# Patient Record
Sex: Male | Born: 1955
Health system: Southern US, Community
[De-identification: ages and names within clinical notes are randomized; demographics above are authoritative.]

## PROBLEM LIST (undated history)

## (undated) DIAGNOSIS — I10 Essential (primary) hypertension: Secondary | ICD-10-CM

## (undated) DIAGNOSIS — E785 Hyperlipidemia, unspecified: Secondary | ICD-10-CM

## (undated) DIAGNOSIS — R7303 Prediabetes: Secondary | ICD-10-CM

## (undated) DIAGNOSIS — H409 Unspecified glaucoma: Secondary | ICD-10-CM

## (undated) HISTORY — DX: Essential (primary) hypertension: I10

## (undated) HISTORY — DX: Prediabetes: R73.03

## (undated) HISTORY — DX: Unspecified glaucoma: H40.9

## (undated) HISTORY — DX: Hyperlipidemia, unspecified: E78.5

---

## 2011-10-10 LAB — HM COLONOSCOPY

## 2014-08-18 HISTORY — PX: SHOULDER SURGERY: SHX246

## 2016-10-08 ENCOUNTER — Ambulatory Visit: Payer: Self-pay | Admitting: Family Medicine

## 2016-10-08 ENCOUNTER — Telehealth: Payer: Self-pay | Admitting: General Practice

## 2016-10-08 NOTE — Telephone Encounter (Signed)
Pt called in at 8:02 to reschedule his appt. He's a truck driver. Pt's truck broke down so he is unable to make his appt this morning. Pt rescheduled for tomorrow morning at 8:30.

## 2016-10-09 ENCOUNTER — Ambulatory Visit: Payer: Self-pay | Admitting: Family Medicine

## 2016-10-09 ENCOUNTER — Encounter: Payer: Self-pay | Admitting: Family Medicine

## 2016-10-09 ENCOUNTER — Telehealth: Payer: Self-pay | Admitting: General Practice

## 2016-10-09 ENCOUNTER — Ambulatory Visit (INDEPENDENT_AMBULATORY_CARE_PROVIDER_SITE_OTHER): Payer: BLUE CROSS/BLUE SHIELD | Admitting: Family Medicine

## 2016-10-09 VITALS — BP 164/92 | HR 54 | Temp 97.8°F | Ht 67.0 in | Wt 143.8 lb

## 2016-10-09 DIAGNOSIS — I10 Essential (primary) hypertension: Secondary | ICD-10-CM

## 2016-10-09 DIAGNOSIS — Z0289 Encounter for other administrative examinations: Secondary | ICD-10-CM

## 2016-10-09 DIAGNOSIS — Z122 Encounter for screening for malignant neoplasm of respiratory organs: Secondary | ICD-10-CM | POA: Diagnosis not present

## 2016-10-09 DIAGNOSIS — Z125 Encounter for screening for malignant neoplasm of prostate: Secondary | ICD-10-CM

## 2016-10-09 DIAGNOSIS — Z0000 Encounter for general adult medical examination without abnormal findings: Secondary | ICD-10-CM

## 2016-10-09 HISTORY — DX: Essential (primary) hypertension: I10

## 2016-10-09 LAB — LIPID PANEL
CHOLESTEROL: 138 mg/dL (ref 0–200)
HDL: 49.4 mg/dL (ref >39.00)
LDL Cholesterol: 66 mg/dL (ref 0–99)
NonHDL: 89.02
Total CHOL/HDL Ratio: 3
Triglycerides: 116 mg/dL (ref 0.0–149.0)
VLDL: 23.2 mg/dL (ref 0.0–40.0)

## 2016-10-09 LAB — HEMOGLOBIN A1C: HEMOGLOBIN A1C: 6.1 % (ref 4.6–6.5)

## 2016-10-09 LAB — COMPREHENSIVE METABOLIC PANEL
ALBUMIN: 4.7 g/dL (ref 3.5–5.2)
ALK PHOS: 60 U/L (ref 39–117)
ALT: 7 U/L (ref 0–53)
AST: 13 U/L (ref 0–37)
BUN: 16 mg/dL (ref 6–23)
CALCIUM: 9.9 mg/dL (ref 8.4–10.5)
CO2: 33 mEq/L — ABNORMAL HIGH (ref 19–32)
Chloride: 104 mEq/L (ref 96–112)
Creatinine, Ser: 0.93 mg/dL (ref 0.40–1.50)
GFR: 106.33 mL/min (ref >60.00)
Glucose, Bld: 129 mg/dL — ABNORMAL HIGH (ref 70–99)
POTASSIUM: 4.9 meq/L (ref 3.5–5.1)
Sodium: 141 mEq/L (ref 135–145)
TOTAL PROTEIN: 6.9 g/dL (ref 6.0–8.3)
Total Bilirubin: 0.8 mg/dL (ref 0.2–1.2)

## 2016-10-09 LAB — PSA: PSA: 1.3 ng/mL (ref 0.10–4.00)

## 2016-10-09 MED ORDER — CHLORTHALIDONE 25 MG PO TABS: 25.0000 mg | ORAL_TABLET | Freq: Every day | ORAL | 1 refills | Status: DC

## 2016-10-09 NOTE — Telephone Encounter (Signed)
Pt called  At 8:30 to confirm his appt time. Showing appt time is at 8:30. Pt says that he really need to come in today. Rescheduled pt's appt for 9:30 instead.

## 2016-10-09 NOTE — Progress Notes (Signed)
Chief Complaint  Patient presents with  . Establish Care    cpe    Well Male Jimmy Lane is here for a complete physical.   His last physical was >1 year ago.  Current diet: in general, a "healthy" diet   Current exercise: walking around at rest areas Weight trend: stable Does pt snore? No. Daytime fatigue? No. Seat belt? Yes.    PCV23 Yes Flu Yes  Tetanus 2013  Hypertension Patient presents for hypertension follow up. He does not monitor home blood pressures. He is compliant with medications- Metoprolol 50 mg BID and HCTZ 25 mg daily. Patient has these side effects of medication: none He is sometimes adhering to a healthy diet overall. Exercise: walking at rest stops   Past Medical History:  Diagnosis Date  . Essential hypertension 10/09/2016    Past Surgical History:  Procedure Laterality Date  . SHOULDER SURGERY  2016   Medications  HCTZ 25 MG DAILY Metoprolol 50 mg BID   Allergies No Known Allergies Family History Family History  Problem Relation Age of Onset  . Hypertension Paternal Grandfather     Review of Systems: Constitutional:  no unexpected change in weight, no fevers or chills Eye:  no recent significant change in vision Ear/Nose/Mouth/Throat:  Ears:  no tinnitus or hearing loss Nose/Mouth/Throat:  no complaints of nasal congestion or bleeding, no sore throat and oral sores Cardiovascular:  no chest pain, no palpitations Respiratory:  no cough and no shortness of breath Gastrointestinal:  no abdominal pain, no change in bowel habits, no nausea, vomiting, diarrhea, or constipation and no black or bloody stool GU:  Male: negative for dysuria, frequency, and incontinence and negative for prostate symptoms Musculoskeletal/Extremities:  no pain, redness, or swelling of the joints Integumentary (Skin/Breast):  no abnormal skin lesions reported Neurologic:  no headaches, no numbness, tingling Endocrine:  weight changes, masses in the neck,  heat/cold intolerance, bowel or skin changes, or cardiovascular system symptoms Hematologic/Lymphatic:  no abnormal bleeding, no HIV risk factors, no night sweats, no swollen nodes, no weight loss  Exam BP (!) 164/92 (BP Location: Left Arm, Patient Position: Sitting, Cuff Size: Small)   Pulse (!) 54   Temp 97.8 F (36.6 C) (Oral)   Ht 5\' 7"  (1.702 m)   Wt 143 lb 12.8 oz (65.2 kg)   SpO2 98%   BMI 22.52 kg/m  General:  well developed, well nourished, in no apparent distress Skin:  no significant moles, warts, or growths Head:  no masses, lesions, or tenderness Eyes:  pupils equal and round, sclera anicteric without injection Ears:  canals without lesions, TMs shiny without retraction, no obvious effusion, no erythema Nose:  nares patent, septum midline, mucosa normal Throat/Pharynx:  lips and gingiva without lesion; tongue and uvula midline; non-inflamed pharynx; no exudates or postnasal drainage Neck: neck supple without adenopathy, thyromegaly, or masses Lungs:  clear to auscultation, breath sounds equal bilaterally, no respiratory distress Cardio:  regular rate and rhythm without murmurs, heart sounds without clicks or rubs Abdomen:  abdomen soft, nontender; bowel sounds normal; no masses or organomegaly Genital (male): circumcised penis, no lesions or discharge; testes present bilaterally without masses or tenderness Rectal: Deferred Musculoskeletal:  symmetrical muscle groups noted without atrophy or deformity Extremities:  no clubbing, cyanosis, or edema, no deformities, no skin discoloration Neuro:  gait normal; deep tendon reflexes normal and symmetric Psych: well oriented with normal range of affect and appropriate judgment/insight  Assessment and Plan  Well adult exam - Plan: Comprehensive metabolic  panel, Lipid panel, Hemoglobin A1c  Screening for prostate cancer - Plan: PSA  Essential hypertension - Plan: chlorthalidone (HYGROTON) 25 MG tablet  Encounter for  screening for lung cancer - Plan: CT CHEST LUNG CANCER SCREENING LOW DOSE WO CONTRAST   Well 61 y.o. male. Counseled on diet and exercise. Stop HCTZ, start chlorthalidone. Recheck labs in 2 weeks when he returns. Will add Norvasc vs Lisinopril if still not controlled. Discussed home BP monitoring.  Immunizations, labs, and further orders as above. I will see him in 1 mo.  The patient voiced understanding and agreement to the plan.  [place], DO 10/09/16 10:27 AM

## 2016-10-09 NOTE — Patient Instructions (Addendum)
Around 3 times per week, check your blood pressure 4 times per day. Twice in the morning and twice in the evening. The readings should be at least one minute apart. Write down these values and bring them to your next nurse visit/appointment.  When you check your BP, make sure you have been doing something calm/relaxing 5 minutes prior to checking. Both feet should be flat on the floor and you should be sitting. Use your left arm and make sure it is in a relaxed position (on a table), and that the cuff is at the approximate level/height of your heart.  Stop smoking.

## 2016-10-09 NOTE — Progress Notes (Signed)
Pre visit review using our clinic review tool, if applicable. No additional management support is needed unless otherwise documented below in the visit note. 

## 2016-10-18 ENCOUNTER — Ambulatory Visit (HOSPITAL_BASED_OUTPATIENT_CLINIC_OR_DEPARTMENT_OTHER): Admission: RE | Admit: 2016-10-18 | Payer: BLUE CROSS/BLUE SHIELD | Source: Ambulatory Visit

## 2016-10-20 ENCOUNTER — Other Ambulatory Visit: Payer: BLUE CROSS/BLUE SHIELD

## 2016-10-22 ENCOUNTER — Other Ambulatory Visit: Payer: BLUE CROSS/BLUE SHIELD

## 2016-10-22 ENCOUNTER — Telehealth: Payer: Self-pay | Admitting: Family Medicine

## 2016-10-22 MED ORDER — METOPROLOL TARTRATE 50 MG PO TABS: 50.0000 mg | ORAL_TABLET | Freq: Two times a day (BID) | ORAL | 1 refills | Status: DC

## 2016-10-22 NOTE — Telephone Encounter (Signed)
Done

## 2016-10-22 NOTE — Telephone Encounter (Signed)
Caller name: Relationship to patient: Self Can be reached: [phone]  Pharmacy:  [facility], [place] [phone] (Phone) [phone] (Fax)     Reason for call: Request refill on metoprolol (LOPRESSOR) 50 MG tablet

## 2016-10-22 NOTE — Telephone Encounter (Signed)
Relation to VC:BSWH Call back number:[phone] Pharmacy:  Reason for call:  Patient calling back checking on the status of medication mentioned below, informed patient of 24 hour medication request prior to running out policy, patient voice understanding stating he was unaware pills were getting low and will be going out of town this afternoon. Please advise

## 2016-11-03 ENCOUNTER — Encounter: Payer: Self-pay | Admitting: Family Medicine

## 2016-11-03 ENCOUNTER — Ambulatory Visit (HOSPITAL_BASED_OUTPATIENT_CLINIC_OR_DEPARTMENT_OTHER)
Admission: RE | Admit: 2016-11-03 | Discharge: 2016-11-03 | Disposition: A | Discharge status: Home / Self Care | Payer: BLUE CROSS/BLUE SHIELD | Source: Ambulatory Visit | Attending: Family Medicine | Admitting: Family Medicine

## 2016-11-03 ENCOUNTER — Ambulatory Visit (INDEPENDENT_AMBULATORY_CARE_PROVIDER_SITE_OTHER): Payer: BLUE CROSS/BLUE SHIELD | Admitting: Family Medicine

## 2016-11-03 VITALS — BP 140/82 | HR 52 | Temp 98.2°F | Ht 67.0 in | Wt 141.2 lb

## 2016-11-03 DIAGNOSIS — Z122 Encounter for screening for malignant neoplasm of respiratory organs: Secondary | ICD-10-CM | POA: Diagnosis not present

## 2016-11-03 DIAGNOSIS — J432 Centrilobular emphysema: Secondary | ICD-10-CM | POA: Diagnosis not present

## 2016-11-03 DIAGNOSIS — F17200 Nicotine dependence, unspecified, uncomplicated: Secondary | ICD-10-CM | POA: Diagnosis not present

## 2016-11-03 DIAGNOSIS — I700 Atherosclerosis of aorta: Secondary | ICD-10-CM | POA: Insufficient documentation

## 2016-11-03 DIAGNOSIS — R7303 Prediabetes: Secondary | ICD-10-CM

## 2016-11-03 DIAGNOSIS — Z9189 Other specified personal risk factors, not elsewhere classified: Secondary | ICD-10-CM | POA: Diagnosis not present

## 2016-11-03 DIAGNOSIS — I10 Essential (primary) hypertension: Secondary | ICD-10-CM | POA: Diagnosis not present

## 2016-11-03 MED ORDER — AMLODIPINE BESYLATE 5 MG PO TABS: 5.0000 mg | ORAL_TABLET | Freq: Every day | ORAL | 2 refills | Status: DC

## 2016-11-03 MED ORDER — ATORVASTATIN CALCIUM 40 MG PO TABS: 40.0000 mg | ORAL_TABLET | Freq: Every day | ORAL | 3 refills | Status: DC

## 2016-11-03 NOTE — Progress Notes (Signed)
Pre visit review using our clinic review tool, if applicable. No additional management support is needed unless otherwise documented below in the visit note. 

## 2016-11-03 NOTE — Progress Notes (Signed)
Chief Complaint  Patient presents with  . Follow-up    discuss recent lab results    Subjective Jimmy Lane is a 61 y.o. male who presents for hypertension follow up. He does monitor home blood pressures. Blood pressures ranging from 130-140's/80's on average. He is compliant with medications- Metoprolol 50 mg once daily (should be twice) and chlorthalidone 25 mg daily. Patient has these side effects of medication: none He is adhering to a healthy diet overall. Current exercise: walking  10 yr CVD In addition to being a smoker, after checking his cholesterol, his 10 year CVD is 30.3%. He is not currently taking a statin or an aspirin. Family history of heart attack or stroke.  Prediabetes His A1c was found to be 6.1. Since that time, he has decreased the amount of sugar in his diet. He is also more active with his walking. Denies any family history of diabetes.   Past Medical History:  Diagnosis Date  . Essential hypertension 10/09/2016   Family History  Problem Relation Age of Onset  . Hypertension Paternal Grandfather    Medications Current Outpatient Prescriptions on File Prior to Visit  Medication Sig Dispense Refill  . chlorthalidone (HYGROTON) 25 MG tablet Take 1 tablet (25 mg total) by mouth daily. 30 tablet 1  . metoprolol (LOPRESSOR) 50 MG tablet Take 1 tablet (50 mg total) by mouth 2 (two) times daily. 180 tablet 1   Allergies No Known Allergies  Review of Systems Cardiovascular: no chest pain Respiratory:  no shortness of breath  Exam BP 140/82 (BP Location: Right Arm, Patient Position: Sitting, Cuff Size: Normal)   Pulse (!) 52   Temp 98.2 F (36.8 C) (Oral)   Ht 5\' 7"  (1.702 m)   Wt 141 lb 3.2 oz (64 kg)   SpO2 92%   BMI 22.12 kg/m  General:  well developed, well nourished, in no apparent distress Skin:  warm, no pallor or diaphoresis Eyes:  pupils equal and round, sclera anicteric without injection Heart :RRR, no murmurs, no bruits, no LE  edema Lungs:  clear to auscultation, no accessory muscle use Psych: well oriented with normal range of affect and appropriate judgment/insight  Essential hypertension - Plan: DISCONTINUED: amLODipine (NORVASC) 5 MG tablet  10 year risk of MI or stroke 7.5% or greater - Plan: atorvastatin (LIPITOR) 40 MG tablet  Prediabetes  Orders as above. Start Lipitor. Start baby aspirin. He is not been taking the metoprolol correctly, he will take twice daily. If still not at goal, will start Norvasc. If his pulse drops much more, we may take him off of the metoprolol. I would like him to bring his blood pressure log to next appointment. Counseled on diet and exercise F/u in 1 mo. The patient voiced understanding and agreement to the plan.  [place], DO 11/03/16  11:49 AM

## 2016-11-03 NOTE — Patient Instructions (Addendum)
Stay well hydrated with water.  Keep up the good work with your diet changes.  If the medicine is too expensive, don't fill and let us know.  Bring your blood pressure log to your next appointment.

## 2016-11-12 ENCOUNTER — Ambulatory Visit: Payer: BLUE CROSS/BLUE SHIELD | Admitting: Family Medicine

## 2016-12-08 ENCOUNTER — Ambulatory Visit: Payer: BLUE CROSS/BLUE SHIELD | Admitting: Family Medicine

## 2016-12-27 ENCOUNTER — Other Ambulatory Visit: Payer: Self-pay | Admitting: Family Medicine

## 2016-12-27 DIAGNOSIS — I10 Essential (primary) hypertension: Secondary | ICD-10-CM

## 2016-12-29 MED ORDER — CHLORTHALIDONE 25 MG PO TABS: 25.0000 mg | ORAL_TABLET | Freq: Every day | ORAL | 0 refills | Status: DC

## 2016-12-29 NOTE — Telephone Encounter (Signed)
Rx sent to the pharmacy by fax.  Confirmation received.//AB/CMA

## 2017-01-02 ENCOUNTER — Telehealth: Payer: Self-pay | Admitting: Family Medicine

## 2017-01-02 NOTE — Telephone Encounter (Signed)
Patient called back to follow up. Request a call if letter is done today.

## 2017-01-02 NOTE — Telephone Encounter (Signed)
Pt signed release for [place] to send letter to [facility] clinic that pt is being treated for BP but it is under control. Scanned release.

## 2017-01-02 NOTE — Telephone Encounter (Signed)
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 Pt is needing letter to be fax to [phone] (pt was informed needed to send a release form signed by him and sent to our office). 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 Please advise ASAP.

## 2017-01-05 NOTE — Telephone Encounter (Signed)
Relation to MD:YJWL Call back [phone]   Reason for call:  Patient calling back checking on the status of message below, patient would like to speak with someone today.

## 2017-01-05 NOTE — Telephone Encounter (Signed)
Verbally spoke with Melissa regarding the message from the pt.  She stated that the pt will need to come in for a office visit to have his BP checked.  Called and spoke with the pt and informed him that I spoke with the provider who is handling Dr. Nani Ravens message, and she stated that the pt will need an office visit to have his BP checked.  Also informed him that I have tried to reach someone at [facility] in [place], but unable to speak with anyone and unable to leave a message.  Pt verbalized understanding and agreed.  Pt asked for a early appointment on (Tues-01/06/17).  Informed the pt that the only thing we have early with a provider is (9:30am) with Dr. Larose Kells.  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  Pt asked to go ahead and be put in at (9:30am) with Dr. Larose Kells, and if he has to cancel he will.//AB/CMA

## 2017-01-05 NOTE — Telephone Encounter (Signed)
Angie, please advise. I am not sure who can handle this in Dr. Irene Limbo absence? Per chart review, patient is overdue for BP follow-up because BP was not controlled at last office visit.

## 2017-01-05 NOTE — Telephone Encounter (Signed)
Patient calling back checking on the status of message below, patient states he cant return back to work until letter is faxed, patient would like to speak with nurse once letter is faxed, please advise best # [phone]

## 2017-01-05 NOTE — Telephone Encounter (Signed)
Verbally spoke with Melissa regarding the message from the pt.  She stated that the pt will need to come in for a office visit to have his BP checked.  Called and spoke with the pt and informed him that I spoke with the provider who is handling Dr. Nani Ravens message, and she stated that the pt will need an office visit to have his BP checked.  Also informed him that I have tried to reach someone at [facility] in [place], but unable to speak with anyone and unable to leave a message.  Pt verbalized understanding and agreed.  Pt asked for a early appointment on (Tues-01/06/17).  Informed the pt that the only thing we have early with a provider is (9:30am) with Dr. Larose Kells.  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  Pt asked to go ahead and be put in at (9:30am) with Dr. Larose Kells, and if he has to cancel he will.//AB/CMA

## 2017-01-06 ENCOUNTER — Ambulatory Visit: Payer: BLUE CROSS/BLUE SHIELD | Admitting: Internal Medicine

## 2017-01-06 DIAGNOSIS — Z0289 Encounter for other administrative examinations: Secondary | ICD-10-CM

## 2017-01-16 ENCOUNTER — Other Ambulatory Visit: Payer: Self-pay | Admitting: Family Medicine

## 2017-01-16 DIAGNOSIS — I10 Essential (primary) hypertension: Secondary | ICD-10-CM

## 2017-01-16 MED ORDER — METOPROLOL TARTRATE 50 MG PO TABS: 50.0000 mg | ORAL_TABLET | Freq: Two times a day (BID) | ORAL | 0 refills | Status: DC

## 2017-01-16 MED ORDER — CHLORTHALIDONE 25 MG PO TABS
25.0000 mg | ORAL_TABLET | Freq: Every day | ORAL | 0 refills | Status: DC
Start: 2017-01-16 — End: 2017-02-27

## 2017-01-16 NOTE — Telephone Encounter (Addendum)
Called and spoke with the pt and informed him that we received the refill request for the Amlodipine, which is no longer taking.  Pt stated that he needs refill on the Metoprolol and Chlorthalidone.  Rx approved for the Metoprolol and Chlorthalidone and sent to the pharmacy by e-script.  Informed the pt that he is due a follow-up appt for his BP with Dr. Nani Ravens.  Pt verbalized understanding and scheduled a follow-up appt for (Wed-01/28/17 @ 9:15am).//AB/CMA     Rx denied-pt is no longer take the Amlodipine.//AB/CMA

## 2017-01-28 ENCOUNTER — Ambulatory Visit: Payer: BLUE CROSS/BLUE SHIELD | Admitting: Family Medicine

## 2017-01-29 ENCOUNTER — Encounter: Payer: Self-pay | Admitting: Family Medicine

## 2017-01-29 ENCOUNTER — Ambulatory Visit (INDEPENDENT_AMBULATORY_CARE_PROVIDER_SITE_OTHER): Payer: BLUE CROSS/BLUE SHIELD | Admitting: Family Medicine

## 2017-01-29 VITALS — BP 135/78 | HR 57 | Temp 97.7°F | Resp 14 | Ht 68.0 in | Wt 137.8 lb

## 2017-01-29 DIAGNOSIS — I10 Essential (primary) hypertension: Secondary | ICD-10-CM

## 2017-01-29 DIAGNOSIS — R7303 Prediabetes: Secondary | ICD-10-CM

## 2017-01-29 HISTORY — DX: Prediabetes: R73.03

## 2017-01-29 NOTE — Patient Instructions (Signed)
Stop smoking! Let us know if you need anything.

## 2017-01-29 NOTE — Progress Notes (Signed)
Chief Complaint  Patient presents with  . Hypertension    Pt here for follow      Subjective Jimmy Lane is a 61 y.o. male who presents for hypertension follow up. He does monitor home blood pressures. Blood pressures ranging from 130's/70's on average. He is compliant with medications. Patient has these side effects of medication: none He is sometimes adhering to a healthy diet overall. Current exercise: walking at rest   Past Medical History:  Diagnosis Date  . Essential hypertension 10/09/2016  . Prediabetes 01/29/2017   Family History  Problem Relation Age of Onset  . Hypertension Paternal Grandfather    Medications Current Outpatient Prescriptions on File Prior to Visit  Medication Sig Dispense Refill  . atorvastatin (LIPITOR) 40 MG tablet Take 1 tablet (40 mg total) by mouth daily. 90 tablet 3  . chlorthalidone (HYGROTON) 25 MG tablet Take 1 tablet (25 mg total) by mouth daily. 90 tablet 0  . metoprolol tartrate (LOPRESSOR) 50 MG tablet Take 1 tablet (50 mg total) by mouth 2 (two) times daily. 180 tablet 0   Allergies No Known Allergies  Review of Systems Cardiovascular: no chest pain Respiratory:  no shortness of breath  Exam BP 135/78 (BP Location: Left Arm, Patient Position: Sitting, Cuff Size: Normal)   Pulse (!) 57   Temp 97.7 F (36.5 C) (Oral)   Resp 14   Ht 5\' 8"  (1.727 m)   Wt 137 lb 12.8 oz (62.5 kg)   SpO2 100%   BMI 20.95 kg/m  General:  well developed, well nourished, in no apparent distress Skin:  warm, no pallor or diaphoresis Eyes:  pupils equal and round, sclera anicteric without injection Heart :RRR, no murmurs, no bruits, no LE edema Lungs:  clear to auscultation, no accessory muscle use Psych: well oriented with normal range of affect and appropriate judgment/insight  Essential hypertension  Prediabetes  Cont same regimen. Counseled on diet and exercise Stop smoking. F/u in 3 mo- recheck labs at that visit. The patient  voiced understanding and agreement to the plan.  [place], DO 01/29/17  11:46 AM

## 2017-02-27 ENCOUNTER — Telehealth: Payer: Self-pay | Admitting: Family Medicine

## 2017-02-27 DIAGNOSIS — I10 Essential (primary) hypertension: Secondary | ICD-10-CM

## 2017-02-27 MED ORDER — CHLORTHALIDONE 25 MG PO TABS: 25.0000 mg | ORAL_TABLET | Freq: Every day | ORAL | 0 refills | Status: DC

## 2017-02-27 NOTE — Telephone Encounter (Signed)
Caller name: Ceasar  Relation to pt: self Call back number: [phone] Pharmacy: Public [facility] at [address], [facility]   Reason for call: Pt requesting refill for chlorthalidone (HYGROTON) 25 MG tablet. Pt also wanted to update his pharmacy, it is no longer [facility] and would like to have rx sent to [facility] (mentioned above). Please advise.

## 2017-02-27 NOTE — Telephone Encounter (Signed)
Rx approved and sent to the pharmacy by e-script.//AB/CMA 

## 2017-05-07 ENCOUNTER — Ambulatory Visit: Payer: BLUE CROSS/BLUE SHIELD | Admitting: Family Medicine

## 2017-05-07 DIAGNOSIS — Z0289 Encounter for other administrative examinations: Secondary | ICD-10-CM

## 2017-06-09 ENCOUNTER — Other Ambulatory Visit: Payer: Self-pay | Admitting: Family Medicine

## 2017-06-09 DIAGNOSIS — I10 Essential (primary) hypertension: Secondary | ICD-10-CM

## 2017-09-15 ENCOUNTER — Other Ambulatory Visit: Payer: Self-pay | Admitting: Family Medicine

## 2017-10-18 ENCOUNTER — Other Ambulatory Visit: Payer: Self-pay | Admitting: Family Medicine

## 2017-10-18 DIAGNOSIS — I10 Essential (primary) hypertension: Secondary | ICD-10-CM

## 2017-10-18 DIAGNOSIS — Z9189 Other specified personal risk factors, not elsewhere classified: Secondary | ICD-10-CM

## 2017-12-25 ENCOUNTER — Telehealth: Payer: Self-pay | Admitting: Family Medicine

## 2017-12-25 DIAGNOSIS — F17200 Nicotine dependence, unspecified, uncomplicated: Secondary | ICD-10-CM

## 2017-12-25 DIAGNOSIS — Z122 Encounter for screening for malignant neoplasm of respiratory organs: Secondary | ICD-10-CM

## 2017-12-25 NOTE — Telephone Encounter (Signed)
Please notify pt he is due for his 12 mo follow up CT scan to screen for lung cancer and place order. TY.

## 2017-12-28 NOTE — Addendum Note (Signed)
Addended by: Sharon Seller B on: 12/28/2017 09:38 AM   Modules accepted: Orders

## 2017-12-28 NOTE — Telephone Encounter (Signed)
Called the patient informed/put in order/to be scheduled for July per patient request.

## 2018-01-19 ENCOUNTER — Other Ambulatory Visit: Payer: Self-pay | Admitting: Family Medicine

## 2018-03-01 ENCOUNTER — Other Ambulatory Visit: Payer: Self-pay | Admitting: Family Medicine

## 2018-03-01 DIAGNOSIS — Z9189 Other specified personal risk factors, not elsewhere classified: Secondary | ICD-10-CM

## 2018-04-03 ENCOUNTER — Other Ambulatory Visit: Payer: Self-pay | Admitting: Family Medicine

## 2018-04-03 DIAGNOSIS — I10 Essential (primary) hypertension: Secondary | ICD-10-CM

## 2018-05-19 ENCOUNTER — Telehealth: Payer: Self-pay | Admitting: Family Medicine

## 2018-05-19 DIAGNOSIS — Z1211 Encounter for screening for malignant neoplasm of colon: Secondary | ICD-10-CM

## 2018-05-19 DIAGNOSIS — Z129 Encounter for screening for malignant neoplasm, site unspecified: Secondary | ICD-10-CM

## 2018-05-19 NOTE — Telephone Encounter (Signed)
Copied from [place] [id number]. Topic: Inquiry >> May 18, 2018  2:18 PM Jimmy Lane wrote: Reason for CRM: pt called to speak w/ the nurse; pt is needing to have an Xray scheduled and needed to speak w/ someone to do so and to update some information on his chart  He wants a chest xray (per PCP suggested once), also wants to schedule a colonscopy. He has appt with PCP on 06/09/2018.

## 2018-05-20 ENCOUNTER — Encounter: Payer: Self-pay | Admitting: Family Medicine

## 2018-05-20 NOTE — Telephone Encounter (Signed)
GI referral done Do you want to order the Chest CT now or wait until you see him  On the 23rd??

## 2018-05-20 NOTE — Addendum Note (Signed)
Addended by: Sharon Seller B on: 05/20/2018 11:14 AM   Modules accepted: Orders

## 2018-05-20 NOTE — Telephone Encounter (Signed)
He needs the CT chest/lungs, not an X-ray. We can order this again if needed. OK to refer to GI for colonoscopy. We will see him on 10/23. TY.

## 2018-05-20 NOTE — Telephone Encounter (Signed)
Ordered per patient request.

## 2018-05-20 NOTE — Telephone Encounter (Signed)
Whatever he prefers. TY.

## 2018-05-20 NOTE — Addendum Note (Signed)
Addended by: Sharon Seller B on: 05/20/2018 01:22 PM   Modules accepted: Orders

## 2018-05-26 ENCOUNTER — Encounter: Payer: Self-pay | Admitting: Family Medicine

## 2018-06-09 ENCOUNTER — Ambulatory Visit (HOSPITAL_BASED_OUTPATIENT_CLINIC_OR_DEPARTMENT_OTHER)
Admission: RE | Admit: 2018-06-09 | Discharge: 2018-06-09 | Disposition: A | Discharge status: Home / Self Care | Payer: PRIVATE HEALTH INSURANCE | Source: Ambulatory Visit | Attending: Family Medicine | Admitting: Family Medicine

## 2018-06-09 ENCOUNTER — Ambulatory Visit (INDEPENDENT_AMBULATORY_CARE_PROVIDER_SITE_OTHER): Payer: PRIVATE HEALTH INSURANCE | Admitting: Family Medicine

## 2018-06-09 ENCOUNTER — Encounter: Payer: Self-pay | Admitting: Family Medicine

## 2018-06-09 ENCOUNTER — Other Ambulatory Visit: Payer: Self-pay | Admitting: Family Medicine

## 2018-06-09 VITALS — BP 140/82 | HR 54 | Temp 98.4°F | Ht 67.0 in | Wt 143.2 lb

## 2018-06-09 DIAGNOSIS — I700 Atherosclerosis of aorta: Secondary | ICD-10-CM | POA: Diagnosis not present

## 2018-06-09 DIAGNOSIS — Z9189 Other specified personal risk factors, not elsewhere classified: Secondary | ICD-10-CM

## 2018-06-09 DIAGNOSIS — Z0000 Encounter for general adult medical examination without abnormal findings: Secondary | ICD-10-CM

## 2018-06-09 DIAGNOSIS — Z23 Encounter for immunization: Secondary | ICD-10-CM | POA: Diagnosis not present

## 2018-06-09 DIAGNOSIS — I2510 Atherosclerotic heart disease of native coronary artery without angina pectoris: Secondary | ICD-10-CM | POA: Diagnosis not present

## 2018-06-09 DIAGNOSIS — Z122 Encounter for screening for malignant neoplasm of respiratory organs: Secondary | ICD-10-CM | POA: Diagnosis not present

## 2018-06-09 DIAGNOSIS — Z125 Encounter for screening for malignant neoplasm of prostate: Secondary | ICD-10-CM | POA: Diagnosis not present

## 2018-06-09 DIAGNOSIS — J432 Centrilobular emphysema: Secondary | ICD-10-CM | POA: Diagnosis not present

## 2018-06-09 DIAGNOSIS — I10 Essential (primary) hypertension: Secondary | ICD-10-CM

## 2018-06-09 DIAGNOSIS — Z129 Encounter for screening for malignant neoplasm, site unspecified: Secondary | ICD-10-CM

## 2018-06-09 LAB — LIPID PANEL
CHOLESTEROL: 93 mg/dL (ref 0–200)
HDL: 42 mg/dL (ref >39.00)
LDL CALC: 33 mg/dL (ref 0–99)
NonHDL: 50.94
TRIGLYCERIDES: 90 mg/dL (ref 0.0–149.0)
Total CHOL/HDL Ratio: 2
VLDL: 18 mg/dL (ref 0.0–40.0)

## 2018-06-09 LAB — COMPREHENSIVE METABOLIC PANEL
ALT: 18 U/L (ref 0–53)
AST: 17 U/L (ref 0–37)
Albumin: 4.4 g/dL (ref 3.5–5.2)
Alkaline Phosphatase: 78 U/L (ref 39–117)
BUN: 23 mg/dL (ref 6–23)
CHLORIDE: 107 meq/L (ref 96–112)
CO2: 31 meq/L (ref 19–32)
CREATININE: 0.92 mg/dL (ref 0.40–1.50)
Calcium: 9.4 mg/dL (ref 8.4–10.5)
GFR: 107.07 mL/min (ref >60.00)
Glucose, Bld: 83 mg/dL (ref 70–99)
Potassium: 5 mEq/L (ref 3.5–5.1)
Sodium: 142 mEq/L (ref 135–145)
Total Bilirubin: 1 mg/dL (ref 0.2–1.2)
Total Protein: 6.6 g/dL (ref 6.0–8.3)

## 2018-06-09 LAB — PSA: PSA: 0.97 ng/mL (ref 0.10–4.00)

## 2018-06-09 LAB — HEMOGLOBIN A1C: HEMOGLOBIN A1C: 5.8 % (ref 4.6–6.5)

## 2018-06-09 MED ORDER — ATORVASTATIN CALCIUM 40 MG PO TABS: 40.0000 mg | ORAL_TABLET | Freq: Every day | ORAL | 3 refills | Status: DC

## 2018-06-09 MED ORDER — CHLORTHALIDONE 25 MG PO TABS: 25.0000 mg | ORAL_TABLET | Freq: Every day | ORAL | 0 refills | Status: DC

## 2018-06-09 MED ORDER — CHLORTHALIDONE 25 MG PO TABS: 25.0000 mg | ORAL_TABLET | Freq: Every day | ORAL | 1 refills | Status: DC

## 2018-06-09 MED ORDER — METOPROLOL TARTRATE 50 MG PO TABS: 50.0000 mg | ORAL_TABLET | Freq: Two times a day (BID) | ORAL | 1 refills | Status: DC

## 2018-06-09 NOTE — Patient Instructions (Addendum)
Give [place] 2-3 business days to get the results of your labs back.   The new Shingrix vaccine (for shingles) is a 2 shot series. It can make people feel low energy, achy and almost like they have the flu for 48 hours after injection. Please plan accordingly when deciding on when to get this shot. Call our office for a nurse visit appointment to get this. The second shot of the series is less severe regarding the side effects, but it still lasts 48 hours.   Keep the diet clean and stay active.  Stop smoking.  Let us know if you need anything.

## 2018-06-09 NOTE — Progress Notes (Signed)
Pre visit review using our clinic review tool, if applicable. No additional management support is needed unless otherwise documented below in the visit note. 

## 2018-06-09 NOTE — Progress Notes (Signed)
Chief Complaint  Patient presents with  . Annual Exam    Well Male Jimmy Lane is here for a complete physical.   His last physical was >1 year ago.  Current diet: in general, a "healthy" diet.  Current exercise: some walking Weight trend: stable Daytime fatigue? No. Seat belt? Yes.    Health maintenance Shingrix- No Colonoscopy- Yes Tetanus- Yes HIV- Yes Hep C- Yes Lung cancer screening- Yes- ordered and will head down after appt Prostate cancer screening- Yes   Past Medical History:  Diagnosis Date  . Essential hypertension 10/09/2016  . Prediabetes 01/29/2017    Past Surgical History:  Procedure Laterality Date  . SHOULDER SURGERY  2016   Medications  Current Outpatient Medications on File Prior to Visit  Medication Sig Dispense Refill  . atorvastatin (LIPITOR) 40 MG tablet TAKE ONE TABLET BY MOUTH ONE TIME DAILY 90 tablet 1  . metoprolol tartrate (LOPRESSOR) 50 MG tablet TAKE ONE TABLET BY MOUTH TWICE A DAY 60 tablet 0  . sildenafil (VIAGRA) 100 MG tablet TAKE ONE TABLET BY MOUTH ONE TIME DAILY AS DIRECTED 10 tablet 0    Allergies No Known Allergies  Family History Family History  Problem Relation Age of Onset  . Hypertension Paternal Grandfather     Review of Systems: Constitutional:  no fevers Eye:  no recent significant change in vision Ear/Nose/Mouth/Throat:  Ears:  no hearing loss Nose/Mouth/Throat:  no complaints of nasal congestion, no sore throat Cardiovascular:  no chest pain, no palpitations Respiratory:  no cough and no shortness of breath Gastrointestinal:  no abdominal pain, no change in bowel habits GU:  Male: negative for dysuria, frequency, and incontinence and negative for prostate symptoms Musculoskeletal/Extremities:  no pain, redness, or swelling of the joints Integumentary (Skin/Breast):  no abnormal skin lesions reported Neurologic:  no headaches Endocrine: No unexpected weight changes Hematologic/Lymphatic:  no abnormal  bleeding  Exam BP 140/82 (BP Location: Left Arm, Patient Position: Sitting, Cuff Size: Normal)   Pulse (!) 54   Temp 98.4 F (36.9 C) (Oral)   Ht 5\' 7"  (1.702 m)   Wt 143 lb 4 oz (65 kg)   SpO2 94%   BMI 22.44 kg/m  General:  well developed, well nourished, in no apparent distress Skin:  no significant moles, warts, or growths Head:  no masses, lesions, or tenderness Eyes:  pupils equal and round, sclera anicteric without injection Ears:  canals without lesions, TMs shiny without retraction, no obvious effusion, no erythema Nose:  nares patent, septum midline, mucosa normal Throat/Pharynx:  lips and gingiva without lesion; tongue and uvula midline; non-inflamed pharynx; no exudates or postnasal drainage Neck: neck supple without adenopathy, thyromegaly, or masses Lungs:  clear to auscultation, breath sounds equal bilaterally, no respiratory distress Cardio:  regular rate and rhythm, no LE edema, no bruits Abdomen:  abdomen soft, nontender; bowel sounds normal; no masses or organomegaly Genital (male): circumcised penis, no lesions or discharge; testes present bilaterally without masses or tenderness Rectal: Deferred Musculoskeletal:  symmetrical muscle groups noted without atrophy or deformity Extremities:  no clubbing, cyanosis, or edema, no deformities, no skin discoloration Neuro:  gait normal; deep tendon reflexes normal and symmetric Psych: well oriented with normal range of affect and appropriate judgment/insight  Assessment and Plan  Well adult exam - Plan: Hemoglobin A1c, Lipid panel, Comprehensive metabolic panel  Essential hypertension - Plan: chlorthalidone (HYGROTON) 25 MG tablet, DISCONTINUED: chlorthalidone (HYGROTON) 25 MG tablet  Need for influenza vaccination - Plan: Flu Vaccine QUAD 6+  mos PF IM (Fluarix Quad PF)  10 year risk of MI or stroke 7.5% or greater - Plan: atorvastatin (LIPITOR) 40 MG tablet  Screening for prostate cancer - Plan: PSA   Well 62  y.o. male. Counseled on diet and exercise. BP OK for age.  Cont meds. Stop smoking. Counseled on risks and benefits of prostate cancer screening with PSA. The patient agrees to undergo testing. Immunizations, labs, and further orders as above. Follow up in 6 mo or prn. The patient voiced understanding and agreement to the plan.  [place], DO 06/09/18 1:32 PM

## 2018-06-10 MED ORDER — METOPROLOL TARTRATE 50 MG PO TABS: 50.0000 mg | ORAL_TABLET | Freq: Two times a day (BID) | ORAL | 1 refills | Status: DC

## 2018-06-10 MED ORDER — CHLORTHALIDONE 25 MG PO TABS: 25.0000 mg | ORAL_TABLET | Freq: Every day | ORAL | 1 refills | Status: DC

## 2018-06-10 MED ORDER — ATORVASTATIN CALCIUM 40 MG PO TABS: 40.0000 mg | ORAL_TABLET | Freq: Every day | ORAL | 3 refills | Status: DC

## 2018-08-30 ENCOUNTER — Encounter: Payer: Self-pay | Admitting: Family Medicine

## 2018-11-05 ENCOUNTER — Other Ambulatory Visit: Payer: Self-pay | Admitting: Family Medicine

## 2018-11-05 DIAGNOSIS — Z9189 Other specified personal risk factors, not elsewhere classified: Secondary | ICD-10-CM

## 2018-11-20 IMAGING — CT CT CHEST LUNG CANCER SCREENING LOW DOSE W/O CM
1 series · 10 of 10 positions shown, 13 images · non-contrast
Comparison: Low-dose lung cancer screening chest CT 11/03/2016.

CLINICAL DATA: 62-year-old male current smoker with 45 pack-year
history of smoking. Lung cancer screening examination.

EXAM:
CT CHEST WITHOUT CONTRAST LOW-DOSE FOR LUNG CANCER SCREENING
TECHNIQUE: Multidetector CT imaging of the chest was performed following the
standard protocol without IV contrast.

[ct lung segmentation data · axial · 0.62mm/px · z∈[-314,-314]mm · 10 of 305 frames shown]
[frame 1/305  mediastinal]
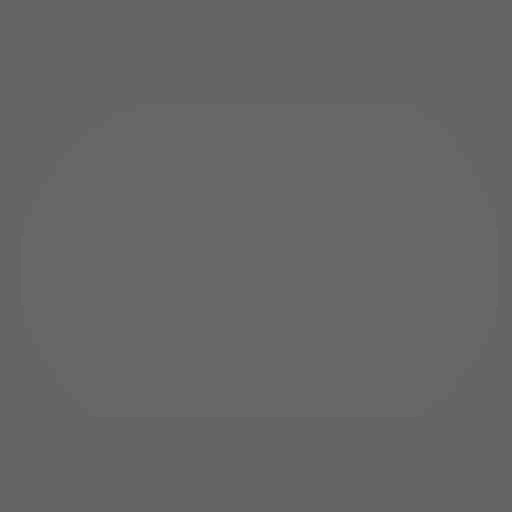
[frame 1/305  lung]
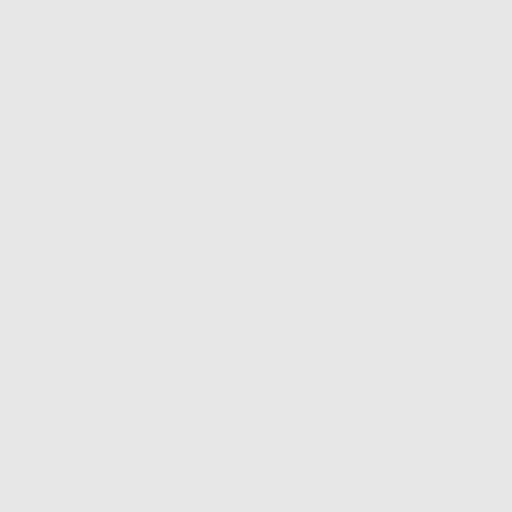
[frame 34/305  lung]
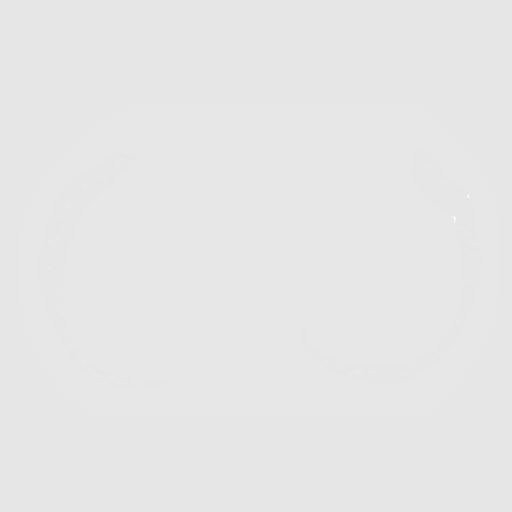
[frame 68/305  lung]
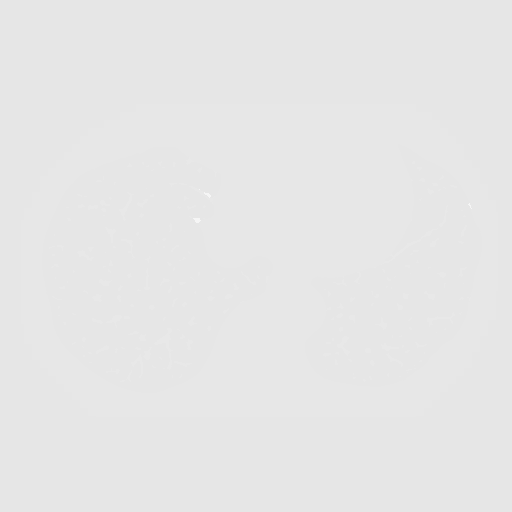
[frame 102/305  lung]
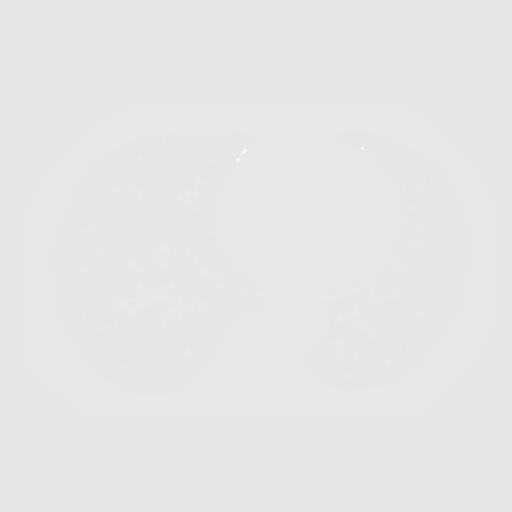
[frame 136/305  mediastinal]
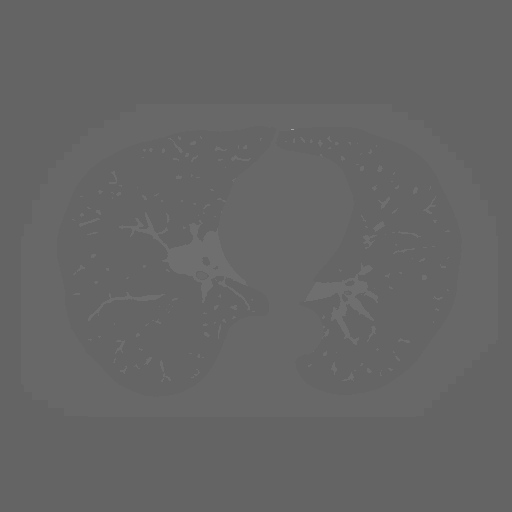
[frame 136/305  lung]
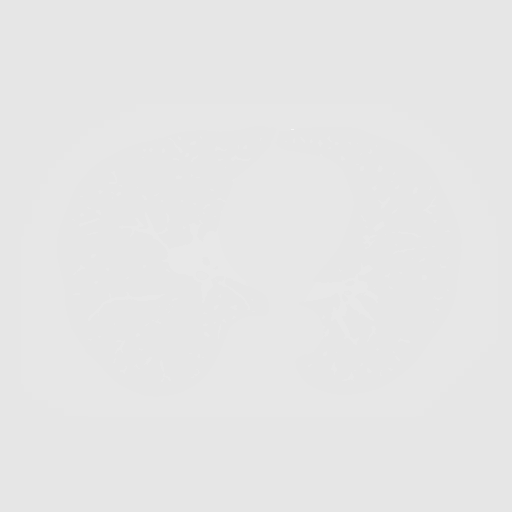
[frame 169/305  lung]
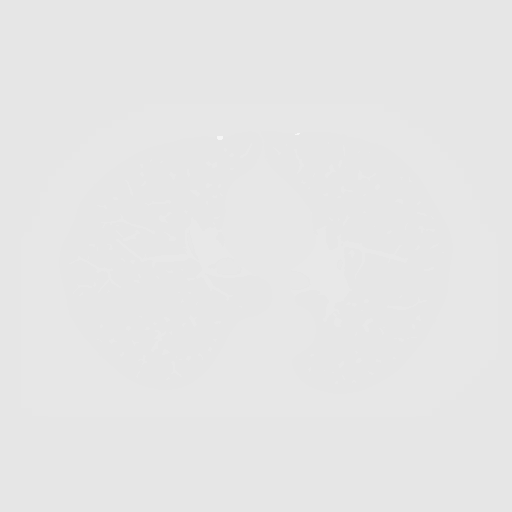
[frame 203/305  lung]
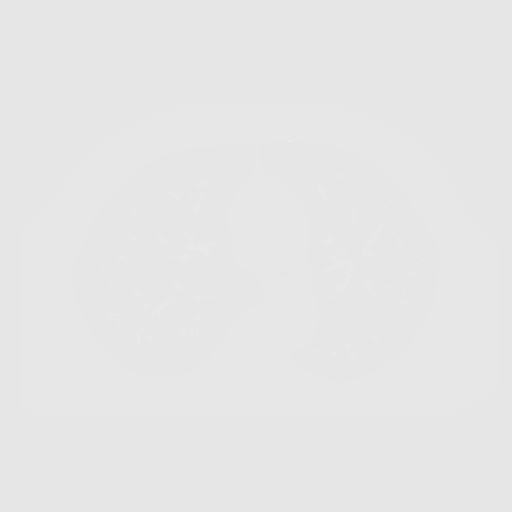
[frame 237/305  lung]
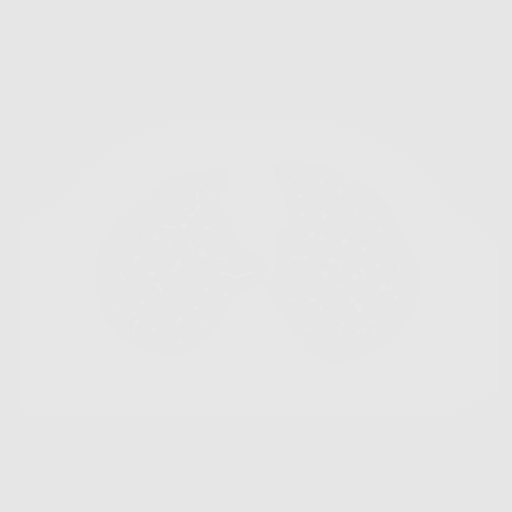
[frame 271/305  mediastinal]
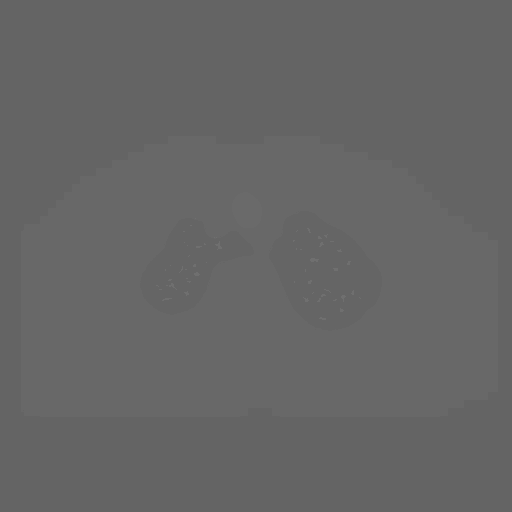
[frame 271/305  lung]
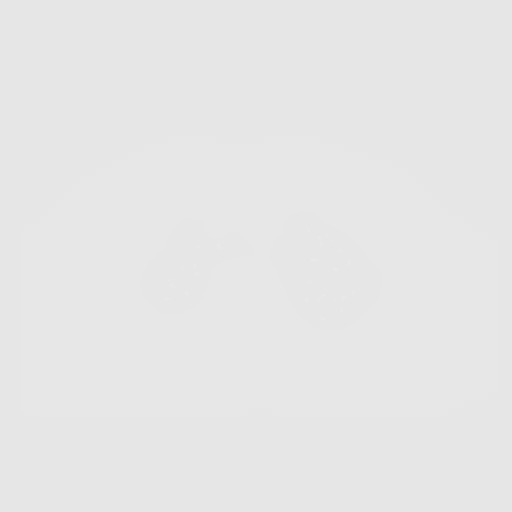
[frame 305/305  lung]
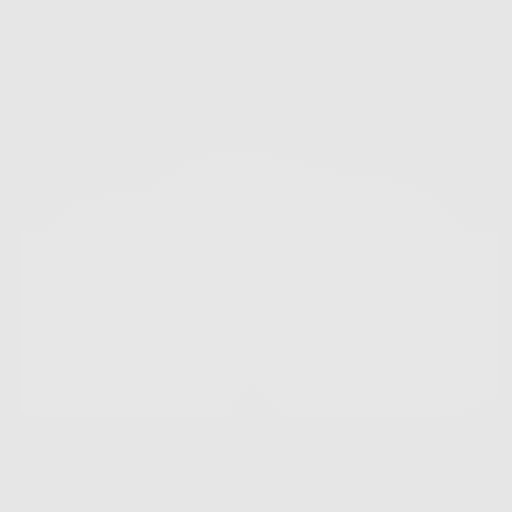

[10 of 10 positions shown; findings below may reference images not displayed]

FINDINGS: Cardiovascular: Heart size is normal. There is no significant
pericardial fluid, thickening or pericardial calcification. There is
aortic atherosclerosis, as well as atherosclerosis of the great
vessels of the mediastinum and the coronary arteries, including
calcified atherosclerotic plaque in the left anterior descending
coronary artery.

Mediastinum/Nodes: No pathologically enlarged mediastinal or hilar
lymph nodes. Please note that accurate exclusion of hilar adenopathy
is limited on noncontrast CT scans. Esophagus is unremarkable in
appearance. No axillary lymphadenopathy.

Lungs/Pleura: Tiny pulmonary nodule in the apex of the right upper
lobe (axial image 35 of series 3), with a volume derived mean
diameter of only 1.7 mm. No larger more suspicious appearing
pulmonary nodules or masses are noted. No acute consolidative
airspace disease. No pleural effusions. Mild diffuse bronchial wall
thickening with mild centrilobular and paraseptal emphysema.

Upper Abdomen: Aortic atherosclerosis.

Musculoskeletal: There are no aggressive appearing lytic or blastic
lesions noted in the visualized portions of the skeleton.
IMPRESSION: 1. Lung-RADS 2S, benign appearance or behavior. Continue annual
screening with low-dose chest CT without contrast in 12 months.
2. The "S" modifier above refers to potentially clinically
significant non lung cancer related findings. Specifically, there is
aortic atherosclerosis, in addition to left anterior descending
coronary artery disease. Please note that although the presence of
coronary artery calcium documents the presence of coronary artery
disease, the severity of this disease and any potential stenosis
cannot be assessed on this non-gated CT examination. Assessment for
potential risk factor modification, dietary therapy or pharmacologic
therapy may be warranted, if clinically indicated.
3. Mild diffuse bronchial wall thickening with mild centrilobular
and paraseptal emphysema; imaging findings suggestive of underlying
COPD.

Aortic Atherosclerosis (3IIB4-PJO.O) and Emphysema (3IIB4-ESV.R).

## 2018-12-09 ENCOUNTER — Ambulatory Visit: Payer: PRIVATE HEALTH INSURANCE | Admitting: Family Medicine

## 2018-12-10 ENCOUNTER — Other Ambulatory Visit: Payer: Self-pay | Admitting: Family Medicine

## 2018-12-29 ENCOUNTER — Ambulatory Visit: Payer: PRIVATE HEALTH INSURANCE | Admitting: Family Medicine

## 2019-01-28 ENCOUNTER — Other Ambulatory Visit: Payer: Self-pay | Admitting: Family Medicine

## 2019-01-28 MED ORDER — SILDENAFIL CITRATE 100 MG PO TABS: 100.0000 mg | ORAL_TABLET | Freq: Every day | ORAL | 0 refills | Status: DC

## 2019-03-08 ENCOUNTER — Other Ambulatory Visit: Payer: Self-pay | Admitting: Family Medicine

## 2019-03-08 DIAGNOSIS — C760 Malignant neoplasm of head, face and neck: Secondary | ICD-10-CM

## 2019-03-08 DIAGNOSIS — Z122 Encounter for screening for malignant neoplasm of respiratory organs: Secondary | ICD-10-CM

## 2019-03-08 DIAGNOSIS — F17200 Nicotine dependence, unspecified, uncomplicated: Secondary | ICD-10-CM

## 2019-03-11 ENCOUNTER — Other Ambulatory Visit: Payer: Self-pay | Admitting: Family Medicine

## 2019-03-11 DIAGNOSIS — I10 Essential (primary) hypertension: Secondary | ICD-10-CM

## 2019-04-22 ENCOUNTER — Other Ambulatory Visit: Payer: Self-pay

## 2019-04-22 ENCOUNTER — Ambulatory Visit: Payer: PRIVATE HEALTH INSURANCE | Admitting: Family Medicine

## 2019-04-22 ENCOUNTER — Ambulatory Visit (INDEPENDENT_AMBULATORY_CARE_PROVIDER_SITE_OTHER): Payer: PRIVATE HEALTH INSURANCE | Admitting: Family Medicine

## 2019-04-22 ENCOUNTER — Encounter: Payer: Self-pay | Admitting: Family Medicine

## 2019-04-22 VITALS — BP 122/80 | HR 95 | Temp 97.3°F | Ht 67.0 in | Wt 146.0 lb

## 2019-04-22 DIAGNOSIS — G5701 Lesion of sciatic nerve, right lower limb: Secondary | ICD-10-CM

## 2019-04-22 MED ORDER — PREDNISONE 20 MG PO TABS: 40.0000 mg | ORAL_TABLET | Freq: Every day | ORAL | 0 refills | Status: AC

## 2019-04-22 NOTE — Progress Notes (Signed)
Musculoskeletal Exam  Patient: Jimmy Lane DOB: Oct 22, 1955  DOS: 04/22/2019  SUBJECTIVE:  Chief Complaint:   Chief Complaint  Patient presents with  . Leg Pain    right    BENGIE CORMICAN is a 63 y.o.  male for evaluation and treatment of R leg pain.   Onset:  3 weeks ago. No inj or change in activity.  Location: RLE Character:  dull and sharp  Progression of issue:  is unchanged Associated symptoms: standing or rotating his back can flare s/s's; gets better through the day; having some back tightness Treatment: to date has been OTC NSAIDS, back support in truck, heat.  Neurovascular symptoms: no  ROS: Musculoskeletal/Extremities: +R leg pain  Past Medical History:  Diagnosis Date  . Essential hypertension 10/09/2016  . Prediabetes 01/29/2017    Objective: VITAL SIGNS: BP 122/80 (BP Location: Left Arm, Patient Position: Sitting, Cuff Size: Normal)   Pulse 95   Temp (!) 97.3 F (36.3 C) (Temporal)   Ht 5\' 7"  (1.702 m)   Wt 146 lb (66.2 kg)   SpO2 96%   BMI 22.87 kg/m  Constitutional: Well formed, well developed. No acute distress. Cardiovascular: Brisk cap refill Thorax & Lungs: No accessory muscle use Musculoskeletal: Low back/RLE.   Normal active range of motion: yes.   Normal passive range of motion: yes Tenderness to palpation: yes over R buttock/piriformis; no ttp over lumbar region Deformity: No Ecchymosis: No Tests positive: none Tests negative: straight leg Tight hamstrings b/l Neurologic: Normal sensory function. No focal deficits noted. DTR's equal and symmetric in LE's. No clonus. Psychiatric: Normal mood. Age appropriate judgment and insight. Alert & oriented x 3.    Assessment:  Piriformis syndrome, right - Plan: predniSONE (DELTASONE) 20 MG tablet  Plan: Stretches/exercises, heat, ice, let me know if no improvement over next 4 weeks and will set up with PT.  F/u as originally scheduled. The patient voiced understanding and agreement to  the plan.   [place], DO 04/22/19  11:56 AM

## 2019-04-22 NOTE — Patient Instructions (Signed)
Heat (pad or rice pillow in microwave) over affected area, 10-15 minutes twice daily.   Ice/cold pack over area for 10-15 min twice daily.  OK to take Tylenol 1000 mg (2 extra strength tabs) or 975 mg (3 regular strength tabs) every 6 hours as needed.  Ok to use ibuprofen after the prednisone is done. Ibuprofen 400-600 mg (2-3 over the counter strength tabs) every 6 hours as needed for pain.  Piriformis Syndrome Rehab It is normal to feel mild stretching, pulling, tightness, or discomfort as you do these exercises, but you should stop right away if you feel sudden pain or your pain gets worse.  Stretching and range of motion exercises These exercises warm up your muscles and joints and improve the movement and flexibility of your hip and pelvis. These exercises also help to relieve pain, numbness, and tingling. Exercise A: Hip rotators   1. Lie on your back on a firm surface. 2. Pull your left / right knee toward your same shoulder with your left / right hand until your knee is pointing toward the ceiling. Hold your left / right ankle with your other hand. 3. Keeping your knee steady, gently pull your left / right ankle toward your other shoulder until you feel a stretch in your buttocks. 4. Hold this position for 30 seconds. Repeat 2 times. Complete this stretch 3 times per week. Exercise B: Hip extensors 1. Lie on your back on a firm surface. Both of your legs should be straight. 2. Pull your left / right knee to your chest. Hold your leg in this position by holding onto the back of your thigh or the front of your knee. 3. Hold this position for 30 seconds. 4. Slowly return to the starting position. Repeat 2 times. Complete this stretch 3 times per week.  Strengthening exercises These exercises build strength and endurance in your hip and thigh muscles. Endurance is the ability to use your muscles for a long time, even after they get tired. Exercise C: Straight leg raises (hip  abductors)    1. Lie on your side with your left / right leg in the top position. Lie so your head, shoulder, knee, and hip line up. Bend your bottom knee to help you balance. 2. Lift your top leg up 4-6 inches (10-15 cm), keeping your toes pointed straight ahead. 3. Hold this position for 1 second. 4. Slowly lower your leg to the starting position. Let your muscles relax completely. Repeat for a total of 10 repetitions. Repeat 2 times. Complete this exercise 3 times per week. Exercise D: Hip abductors and rotators, quadruped   1. Get on your hands and knees on a firm, lightly padded surface. Your hands should be directly below your shoulders, and your knees should be directly below your hips. 2. Lift your left / right knee out to the side. Keep your knee bent. Do not twist your body. 3. Hold this position for 1 seconds. 4. Slowly lower your leg. Repeat for a total of 10 repetitions.  Repeat 1 times. Complete this exercise 3 times per week. Exercise E: Straight leg raises (hip extensors) 1. Lie on your abdomen on a bed or a firm surface with a pillow under your hips. 2. Squeeze your buttock muscles and lift your left / right thigh off the bed. Do not let your back arch. 3. Hold this position for 3 seconds. 4. Slowly return to the starting position. Let your muscles relax completely before doing another repetition. Repeat  2 times. Complete this exercise 3 times per week.  This information is not intended to replace advice given to you by your health care provider. Make sure you discuss any questions you have with your health care provider. Document Released: 08/04/2005 Document Revised: 04/08/2016 Document Reviewed: 07/17/2015 Elsevier Interactive Patient Education  [employer].

## 2019-07-11 ENCOUNTER — Other Ambulatory Visit: Payer: Self-pay | Admitting: Family Medicine

## 2019-07-11 DIAGNOSIS — I10 Essential (primary) hypertension: Secondary | ICD-10-CM

## 2019-07-11 DIAGNOSIS — Z9189 Other specified personal risk factors, not elsewhere classified: Secondary | ICD-10-CM

## 2019-08-07 ENCOUNTER — Other Ambulatory Visit: Payer: Self-pay | Admitting: Family Medicine

## 2019-08-08 ENCOUNTER — Encounter: Payer: Self-pay | Admitting: Family Medicine

## 2019-08-08 ENCOUNTER — Other Ambulatory Visit: Payer: Self-pay | Admitting: Family Medicine

## 2019-08-09 MED ORDER — SILDENAFIL CITRATE 100 MG PO TABS: 100.0000 mg | ORAL_TABLET | Freq: Every day | ORAL | 0 refills | Status: DC

## 2019-08-24 ENCOUNTER — Encounter: Payer: Self-pay | Admitting: Family Medicine

## 2019-10-05 ENCOUNTER — Other Ambulatory Visit: Payer: Self-pay | Admitting: Family Medicine

## 2019-11-05 ENCOUNTER — Encounter: Payer: Self-pay | Admitting: Family Medicine

## 2019-11-11 ENCOUNTER — Encounter: Payer: Self-pay | Admitting: Family Medicine

## 2019-11-11 ENCOUNTER — Ambulatory Visit (INDEPENDENT_AMBULATORY_CARE_PROVIDER_SITE_OTHER): Payer: PRIVATE HEALTH INSURANCE | Admitting: Family Medicine

## 2019-11-11 ENCOUNTER — Other Ambulatory Visit: Payer: Self-pay

## 2019-11-11 VITALS — BP 138/84 | HR 58 | Temp 96.4°F | Ht 67.0 in | Wt 142.0 lb

## 2019-11-11 DIAGNOSIS — L282 Other prurigo: Secondary | ICD-10-CM

## 2019-11-11 MED ORDER — TRIAMCINOLONE ACETONIDE 0.1 % EX CREA: 1.0000 "application " | TOPICAL_CREAM | Freq: Two times a day (BID) | CUTANEOUS | 0 refills | Status: AC

## 2019-11-11 NOTE — Progress Notes (Signed)
Chief Complaint  Patient presents with  . Rash    on both hands/itching and burning    Jimmy Lane is a 63 y.o. male here for a skin complaint.  Duration: 10 days Location: hands and stomach Pruritic? Yes Painful? Yes- burns Drainage? No New soaps/lotions/topicals/detergents? No Sick contacts? No Other associated symptoms: no fevers or recent illness Therapies tried thus far: lotion helps, temporary though  Past Medical History:  Diagnosis Date  . Essential hypertension 10/09/2016  . Prediabetes 01/29/2017    BP 138/84 (BP Location: Left Arm, Patient Position: Sitting, Cuff Size: Normal)   Pulse (!) 58   Temp (!) 96.4 F (35.8 C) (Temporal)   Ht 5\' 7"  (1.702 m)   Wt 142 lb (64.4 kg)   SpO2 97%   BMI 22.24 kg/m  Gen: awake, alert, appearing stated age Lungs: No accessory muscle use Skin: See below.  There is a dome-shaped area of excoriation on his abdominal region to the right of the navel.  There is no excessive warmth, tenderness to palpation, erythema, fluctuance, or drainage. Psych: Age appropriate judgment and insight   Small macules on the palms of the hands without fluctuance, tenderness to palpation, scaling, or drainage.  There is no excessive warmth.  Pruritic rash - Plan: triamcinolone cream (KENALOG) 0.1 %  Avoid scented products.  Trial topical steroid.  Continue moisturizing.  I will see him in 10 days if no improvement and we will biopsy the lesion of the stomach. The patient voiced understanding and agreement to the plan.  [place], DO 11/11/19 9:25 AM

## 2019-11-11 NOTE — Patient Instructions (Signed)
Let me know if anything changes.  I want to see you in 10 days if no better. Cancel appointment if you are better.   Continue using scentless products/lotions/soaps.   Let us know if you need anything.

## 2019-11-16 ENCOUNTER — Telehealth: Payer: Self-pay | Admitting: Family Medicine

## 2019-11-16 ENCOUNTER — Other Ambulatory Visit: Payer: Self-pay | Admitting: Family Medicine

## 2019-11-16 DIAGNOSIS — Z1211 Encounter for screening for malignant neoplasm of colon: Secondary | ICD-10-CM

## 2019-11-16 NOTE — Telephone Encounter (Signed)
Referral placed.

## 2019-11-16 NOTE — Telephone Encounter (Signed)
OK to place referral. Ty.  

## 2019-11-16 NOTE — Telephone Encounter (Signed)
Patient states he was suppose to have a colonscopy done back last year. I dont see GI referral.. Pls advise patient once order is in so he can be call to get scheduled. He states he really needs it done.

## 2019-11-24 ENCOUNTER — Other Ambulatory Visit: Payer: Self-pay

## 2019-11-25 ENCOUNTER — Ambulatory Visit (INDEPENDENT_AMBULATORY_CARE_PROVIDER_SITE_OTHER): Payer: PRIVATE HEALTH INSURANCE | Admitting: Family Medicine

## 2019-11-25 ENCOUNTER — Encounter: Payer: Self-pay | Admitting: Family Medicine

## 2019-11-25 ENCOUNTER — Encounter: Payer: Self-pay | Admitting: Gastroenterology

## 2019-11-25 ENCOUNTER — Other Ambulatory Visit: Payer: Self-pay

## 2019-11-25 ENCOUNTER — Ambulatory Visit: Payer: PRIVATE HEALTH INSURANCE | Admitting: Family Medicine

## 2019-11-25 VITALS — BP 136/86 | HR 50 | Temp 97.0°F | Resp 16 | Ht 67.0 in | Wt 140.0 lb

## 2019-11-25 DIAGNOSIS — Z1211 Encounter for screening for malignant neoplasm of colon: Secondary | ICD-10-CM

## 2019-11-25 DIAGNOSIS — R3129 Other microscopic hematuria: Secondary | ICD-10-CM | POA: Diagnosis not present

## 2019-11-25 DIAGNOSIS — L282 Other prurigo: Secondary | ICD-10-CM

## 2019-11-25 LAB — POC URINALSYSI DIPSTICK (AUTOMATED)
Bilirubin, UA: NEGATIVE
Blood, UA: NEGATIVE
Glucose, UA: NEGATIVE
Ketones, UA: NEGATIVE
Leukocytes, UA: NEGATIVE
Nitrite, UA: NEGATIVE
Protein, UA: POSITIVE — AB
Spec Grav, UA: 1.015 (ref 1.010–1.025)
Urobilinogen, UA: 1 E.U./dL
pH, UA: 7 (ref 5.0–8.0)

## 2019-11-25 NOTE — Progress Notes (Signed)
Chief Complaint  Patient presents with  . Priritic rash    follow up  . Hematuria    Was advise at physical exam appointment for work that he had "small traces of blood in the urine"    Subjective: Patient is a 64 y.o. male here for f/u.  Patient was seen 10 days ago for a pruritic rash.  He was treated with 0.1% triamcinolone twice daily.  Most of his lesions have resolved but there is one on the palm of his left hand that lingers.  No pain, redness, drainage, bruising.  It is not growing or changing in size.  Patient has a longstanding history of kidney stones.  He is known to have microscopic hematuria as well.  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  Denies any pain, bleeding, drainage.  He has seen urology in the past.  Past Medical History:  Diagnosis Date  . Essential hypertension 10/09/2016  . Prediabetes 01/29/2017    Objective: BP 136/86 (BP Location: Left Arm, Patient Position: Sitting, Cuff Size: Small)   Pulse (!) 50   Temp (!) 97 F (36.1 C) (Temporal)   Resp 16   Ht 5\' 7"  (1.702 m)   Wt 140 lb (63.5 kg)   SpO2 100%   BMI 21.93 kg/m  General: Awake, appears stated age HEENT: MMM, EOMi Heart: RRR GI: Abdomen soft, nontender, nondistended, bowel sounds+ Lungs: CTAB, no rales, wheezes or rhonchi. No accessory muscle use Psych: Age appropriate judgment and insight, normal affect and mood  Assessment and Plan: Pruritic rash  Microscopic hematuria - Plan: POCT Urinalysis Dipstick (Automated)  Screen for colon cancer - Plan: Ambulatory referral to Gastroenterology  1-continue triamcinolone for another 10 days.  If no improvement, will biopsy. 2-check urine microscopy.  If positive for blood, will check x-ray.  Offered referral to urology, he declined at this time. The patient voiced understanding and agreement to the plan.  [place], DO 11/25/19  11:45 AM

## 2019-11-25 NOTE — Patient Instructions (Addendum)
Stay on the triamcinolone cream for another 10 days. Continue regular moisturizing. Avoid scented products. Schedule appointment for biopsy if no improvement over next 1.5 weeks.  Give [place] 2-3 business days to get the results of your urine back.   Let me know if you change your mind about seeing a urologist for your stones.   Let us know if you need anything.

## 2019-11-25 NOTE — Addendum Note (Signed)
Addended by: Kelle Darting A on: 11/25/2019 04:39 PM   Modules accepted: Orders

## 2019-11-25 NOTE — Addendum Note (Signed)
Addended by: Jiles Prows on: 11/25/2019 02:23 PM   Modules accepted: Orders

## 2019-11-26 LAB — URINALYSIS, MICROSCOPIC ONLY
Bacteria, UA: NONE SEEN /HPF
Hyaline Cast: NONE SEEN /LPF
Squamous Epithelial / LPF: NONE SEEN /HPF (ref <=5)
WBC, UA: NONE SEEN /HPF (ref 0–5)

## 2019-11-28 ENCOUNTER — Encounter (INDEPENDENT_AMBULATORY_CARE_PROVIDER_SITE_OTHER): Payer: Self-pay | Admitting: *Deleted

## 2019-12-23 ENCOUNTER — Ambulatory Visit (AMBULATORY_SURGERY_CENTER): Payer: Self-pay | Admitting: *Deleted

## 2019-12-23 ENCOUNTER — Other Ambulatory Visit: Payer: Self-pay

## 2019-12-23 VITALS — Temp 97.8°F | Ht 67.0 in | Wt 145.0 lb

## 2019-12-23 DIAGNOSIS — Z1211 Encounter for screening for malignant neoplasm of colon: Secondary | ICD-10-CM

## 2019-12-23 NOTE — Progress Notes (Signed)

## 2020-01-05 ENCOUNTER — Other Ambulatory Visit: Payer: Self-pay | Admitting: Family Medicine

## 2020-01-05 ENCOUNTER — Other Ambulatory Visit: Payer: Self-pay

## 2020-01-05 DIAGNOSIS — I10 Essential (primary) hypertension: Secondary | ICD-10-CM

## 2020-01-06 ENCOUNTER — Encounter: Payer: Self-pay | Admitting: Gastroenterology

## 2020-01-06 ENCOUNTER — Ambulatory Visit (AMBULATORY_SURGERY_CENTER): Payer: PRIVATE HEALTH INSURANCE | Admitting: Gastroenterology

## 2020-01-06 ENCOUNTER — Other Ambulatory Visit: Payer: Self-pay

## 2020-01-06 VITALS — BP 120/75 | HR 42 | Temp 96.9°F | Resp 13 | Ht 67.0 in | Wt 140.0 lb

## 2020-01-06 DIAGNOSIS — D122 Benign neoplasm of ascending colon: Secondary | ICD-10-CM

## 2020-01-06 DIAGNOSIS — D123 Benign neoplasm of transverse colon: Secondary | ICD-10-CM

## 2020-01-06 DIAGNOSIS — K5730 Diverticulosis of large intestine without perforation or abscess without bleeding: Secondary | ICD-10-CM

## 2020-01-06 DIAGNOSIS — K635 Polyp of colon: Secondary | ICD-10-CM

## 2020-01-06 DIAGNOSIS — K640 First degree hemorrhoids: Secondary | ICD-10-CM

## 2020-01-06 DIAGNOSIS — Z1211 Encounter for screening for malignant neoplasm of colon: Secondary | ICD-10-CM

## 2020-01-06 DIAGNOSIS — K6389 Other specified diseases of intestine: Secondary | ICD-10-CM

## 2020-01-06 DIAGNOSIS — D125 Benign neoplasm of sigmoid colon: Secondary | ICD-10-CM

## 2020-01-06 MED ORDER — SODIUM CHLORIDE 0.9 % IV SOLN: 500.0000 mL | Freq: Once | INTRAVENOUS | Status: DC

## 2020-01-06 MED ORDER — SILDENAFIL CITRATE 100 MG PO TABS: 100.0000 mg | ORAL_TABLET | Freq: Every day | ORAL | 1 refills | Status: DC

## 2020-01-06 NOTE — Op Note (Signed)
Central Patient Name: Jimmy Lane Procedure Date: 01/06/2020 7:23 AM MRN: [id number] Endoscopist: Gerrit Heck , MD Age: 64 Referring MD:  Date of Birth: Sep 07, 1955 Gender: Male Account #: [id number] Procedure:                Colonoscopy Indications:              Screening for colorectal malignant neoplasm Medicines:                Monitored Anesthesia Care Procedure:                Pre-Anesthesia Assessment:                           - Prior to the procedure, a History and Physical                            was performed, and patient medications and                            allergies were reviewed. The patient's tolerance of                            previous anesthesia was also reviewed. The risks                            and benefits of the procedure and the sedation                            options and risks were discussed with the patient.                            All questions were answered, and informed consent                            was obtained. Prior Anticoagulants: The patient has                            taken no previous anticoagulant or antiplatelet                            agents. ASA Grade Assessment: II - A patient with                            mild systemic disease. After reviewing the risks                            and benefits, the patient was deemed in                            satisfactory condition to undergo the procedure.                           After obtaining informed consent, the colonoscope  was passed under direct vision. Throughout the                            procedure, the patient's blood pressure, pulse, and                            oxygen saturations were monitored continuously. The                            Colonoscope was introduced through the anus and                            advanced to the the cecum, identified by                            appendiceal orifice and  ileocecal valve. The                            colonoscopy was performed without difficulty. The                            patient tolerated the procedure well. The quality                            of the bowel preparation was good. The ileocecal                            valve, appendiceal orifice, and rectum were                            photographed. Scope In: 8:04:00 AM Scope Out: 8:35:02 AM Scope Withdrawal Time: 0 hours 26 minutes 28 seconds  Total Procedure Duration: 0 hours 31 minutes 2 seconds  Findings:                 The perianal and digital rectal examinations were                            normal.                           Three sessile polyps were found in the hepatic                            flexure (2) and ascending colon. The polyps were 4                            to 6 mm in size. These polyps were removed with a                            cold snare. Resection and retrieval were complete.                            Estimated blood loss was minimal.  Two sessile polyps were found in the sigmoid colon.                            The polyps were 3 to 4 mm in size. These polyps                            were removed with a cold snare. Resection and                            retrieval were complete. Estimated blood loss was                            minimal.                           Multiple small and large-mouthed diverticula were                            found in the recto-sigmoid colon, sigmoid colon and                            ascending colon.                           Non-bleeding internal hemorrhoids were found during                            retroflexion. The hemorrhoids were small. Complications:            No immediate complications. Estimated Blood Loss:     Estimated blood loss was minimal. Impression:               - Three 4 to 6 mm polyps at the hepatic flexure and                            in the ascending  colon, removed with a cold snare.                            Resected and retrieved.                           - Two 3 to 4 mm polyps in the sigmoid colon,                            removed with a cold snare. Resected and retrieved.                           - Diverticulosis in the recto-sigmoid colon, in the                            sigmoid colon and in the ascending colon.                           - Non-bleeding internal hemorrhoids. Recommendation:           -  Patient has a contact number available for                            emergencies. The signs and symptoms of potential                            delayed complications were discussed with the                            patient. Return to normal activities tomorrow.                            Written discharge instructions were provided to the                            patient.                           - Resume previous diet.                           - Continue present medications.                           - Await pathology results.                           - Repeat colonoscopy in 3 - 5 years for                            surveillance based on pathology results.                           - Return to GI clinic PRN. Gerrit Heck, MD 01/06/2020 8:39:25 AM

## 2020-01-06 NOTE — Patient Instructions (Signed)
YOU HAD AN ENDOSCOPIC PROCEDURE TODAY AT THE [facility]:   Refer to the procedure report that was given to you for any specific questions about what was found during the examination.  If the procedure report does not answer your questions, please call your gastroenterologist to clarify.  If you requested that your care partner not be given the details of your procedure findings, then the procedure report has been included in a sealed envelope for you to review at your convenience later.  YOU SHOULD EXPECT: Some feelings of bloating in the abdomen. Passage of more gas than usual.  Walking can help get rid of the air that was put into your GI tract during the procedure and reduce the bloating. If you had a lower endoscopy (such as a colonoscopy or flexible sigmoidoscopy) you may notice spotting of blood in your stool or on the toilet paper. If you underwent a bowel prep for your procedure, you may not have a normal bowel movement for a few days.  Please Note:  You might notice some irritation and congestion in your nose or some drainage.  This is from the oxygen used during your procedure.  There is no need for concern and it should clear up in a day or so.  SYMPTOMS TO REPORT IMMEDIATELY:   Following lower endoscopy (colonoscopy or flexible sigmoidoscopy):  Excessive amounts of blood in the stool  Significant tenderness or worsening of abdominal pains  Swelling of the abdomen that is new, acute  Fever of 100F or higher  For urgent or emergent issues, a gastroenterologist can be reached at any hour by calling [phone]. Do not use MyChart messaging for urgent concerns.    DIET:  We do recommend a small meal at first, but then you may proceed to your regular diet.  Drink plenty of fluids but you should avoid alcoholic beverages for 24 hours.  ACTIVITY:  You should plan to take it easy for the rest of today and you should NOT DRIVE or use heavy machinery until tomorrow (because  of the sedation medicines used during the test).    FOLLOW UP: Our staff will call the number listed on your records 48-72 hours following your procedure to check on you and address any questions or concerns that you may have regarding the information given to you following your procedure. If we do not reach you, we will leave a message.  We will attempt to reach you two times.  During this call, we will ask if you have developed any symptoms of COVID 19. If you develop any symptoms (ie: fever, flu-like symptoms, shortness of breath, cough etc.) before then, please call [phone].  If you test positive for Covid 19 in the 2 weeks post procedure, please call and report this information to us.    If any biopsies were taken you will be contacted by phone or by letter within the next 1-3 weeks.  Please call us at [phone] if you have not heard about the biopsies in 3 weeks.    SIGNATURES/CONFIDENTIALITY: You and/or your care partner have signed paperwork which will be entered into your electronic medical record.  These signatures attest to the fact that that the information above on your After Visit Summary has been reviewed and is understood.  Full responsibility of the confidentiality of this discharge information lies with you and/or your care-partner. 

## 2020-01-06 NOTE — Progress Notes (Signed)
Vitals-DT Temp-JB  Pt's states no medical or surgical changes since previsit or office visit. 

## 2020-01-06 NOTE — Progress Notes (Signed)
A and O x3. Report to RN. Tolerated MAC anesthesia well.

## 2020-01-06 NOTE — Progress Notes (Signed)
Called to room to assist during endoscopic procedure.  Patient ID and intended procedure confirmed with present staff. Received instructions for my participation in the procedure from the performing physician.  

## 2020-01-10 ENCOUNTER — Telehealth: Payer: Self-pay

## 2020-01-10 NOTE — Telephone Encounter (Signed)
  Follow up Call-  Call back number 01/06/2020  Post procedure Call Back phone  # [phone]  Permission to leave phone message Yes  Some recent data might be hidden     Patient questions:  Do you have a fever, pain , or abdominal swelling? No. Pain Score  0 *  Have you tolerated food without any problems? Yes.    Have you been able to return to your normal activities? Yes.    Do you have any questions about your discharge instructions: Diet   No. Medications  No. Follow up visit  No.  Do you have questions or concerns about your Care? No.  Actions: * If pain score is 4 or above: No action needed, pain <4.   1. Have you developed a fever since your procedure? no  2.   Have you had an respiratory symptoms (SOB or cough) since your procedure? no  3.   Have you tested positive for COVID 19 since your procedure no  4.   Have you had any family members/close contacts diagnosed with the COVID 19 since your procedure?  no   If yes to any of these questions please route to Joylene John, RN and Erenest Rasher, RN

## 2020-01-18 ENCOUNTER — Encounter: Payer: Self-pay | Admitting: Gastroenterology

## 2020-01-24 DIAGNOSIS — Z9189 Other specified personal risk factors, not elsewhere classified: Secondary | ICD-10-CM

## 2020-01-24 MED ORDER — ATORVASTATIN CALCIUM 40 MG PO TABS: 40.0000 mg | ORAL_TABLET | Freq: Every day | ORAL | 3 refills | Status: DC

## 2020-03-25 ENCOUNTER — Other Ambulatory Visit: Payer: Self-pay | Admitting: Family Medicine

## 2020-06-05 ENCOUNTER — Other Ambulatory Visit (HOSPITAL_BASED_OUTPATIENT_CLINIC_OR_DEPARTMENT_OTHER): Payer: Self-pay | Admitting: Internal Medicine

## 2020-06-05 ENCOUNTER — Ambulatory Visit: Payer: PRIVATE HEALTH INSURANCE | Attending: Internal Medicine

## 2020-06-05 DIAGNOSIS — Z23 Encounter for immunization: Secondary | ICD-10-CM

## 2020-06-05 MED FILL — FLUARIX QUADRIVALENT 0.5 ML: 0.5 | 1 days supply | Qty: 1 | Fill #0

## 2020-06-05 NOTE — Progress Notes (Signed)
° °  Covid-19 Vaccination Clinic  Name:  Jimmy Lane    MRN: [id number] DOB: [birth year]  06/05/2020  [name] was observed post Covid-19 immunization for 15 minutes without incident. He was provided with Vaccine Information Sheet and instruction to access the V-Safe system.   [name] was instructed to call 911 with any severe reactions post vaccine:  Difficulty breathing   Swelling of face and throat   A fast heartbeat   A bad rash all over body   Dizziness and weakness

## 2020-06-09 ENCOUNTER — Other Ambulatory Visit: Payer: Self-pay | Admitting: Family Medicine

## 2020-06-12 MED FILL — PFIZER-BIONTECH COVID-19 VA: 30 | 1 days supply | Qty: 0 | Fill #0

## 2020-08-07 ENCOUNTER — Other Ambulatory Visit: Payer: Self-pay | Admitting: Family Medicine

## 2020-08-07 DIAGNOSIS — I10 Essential (primary) hypertension: Secondary | ICD-10-CM

## 2020-08-09 ENCOUNTER — Ambulatory Visit (INDEPENDENT_AMBULATORY_CARE_PROVIDER_SITE_OTHER): Payer: Self-pay | Admitting: Family Medicine

## 2020-08-09 ENCOUNTER — Encounter: Payer: Self-pay | Admitting: Family Medicine

## 2020-08-09 ENCOUNTER — Other Ambulatory Visit: Payer: Self-pay

## 2020-08-09 VITALS — BP 144/81 | HR 61 | Temp 98.9°F | Resp 16 | Ht 67.0 in | Wt 144.0 lb

## 2020-08-09 DIAGNOSIS — K529 Noninfective gastroenteritis and colitis, unspecified: Secondary | ICD-10-CM

## 2020-08-09 MED ORDER — DICYCLOMINE HCL 10 MG PO CAPS: ORAL_CAPSULE | ORAL | 0 refills | Status: AC

## 2020-08-09 MED ORDER — ONDANSETRON 4 MG PO TBDP: 4.0000 mg | ORAL_TABLET | Freq: Three times a day (TID) | ORAL | 0 refills | Status: AC | PRN

## 2020-08-09 NOTE — Progress Notes (Signed)
Chief Complaint  Patient presents with  . Diarrhea    Onset: 1 week.watery , loose .    Jimmy Lane is 64 y.o. male here for complaint of diarrhea.  Duration: 1 week Abdominal pain? Crampy type sensation Bleeding? No Recent travel? No Recent antibiotic use? No Sick contacts? No Fevers? No  Bleeding? No Nausea? Yes Vomiting? No Therapies tried: Imodium  Past Medical History:  Diagnosis Date  . Essential hypertension 10/09/2016  . Glaucoma   . Hyperlipidemia   . Prediabetes 01/29/2017    BP (!) 144/81   Pulse 61   Temp 98.9 F (37.2 C) (Oral)   Resp 16   Ht 5\' 7"  (1.702 m)   Wt 144 lb (65.3 kg)   SpO2 100%   BMI 22.55 kg/m  Gen: awake, alert, appearing stated age HEENT: MMM Heart: RRR, no LE edema Lungs: CTAB, no accessory muscle use Abd: BS+, soft, TTP, non-distended, no masses or organomegaly Psych: Age appropriate judgment and insight  Gastroenteritis - Plan: ondansetron (ZOFRAN-ODT) 4 MG disintegrating tablet, dicyclomine (BENTYL) 10 MG capsule, Stool Culture, Clostridium Difficile by PCR([facility]), Ova and parasite examination  Bacterial infection seems unlikely given his benign exam and lack of systemic s/s's.  Ck stool culture, hopefully he can give [place] one today.  Encouraged adequate hydration with water and electrolyte replacement. Warning signs and symptoms verbalized and written down in AVS.  F/u prn. The patient voiced understanding and agreement to the plan.  [place], DO 08/09/20 11:30 AM

## 2020-08-09 NOTE — Patient Instructions (Signed)
Drink something with electrolytes (Powerade/Gatorade/Pedialyte) in it as long as you are having diarrhea.   Keep an eye on food triggers.  Eat something so you can give [place] a stool sample. We will be in touch regarding your results over the next 3-4 days.   Let us know if you need anything.

## 2020-08-10 LAB — CLOSTRIDIUM DIFFICILE BY PCR: Toxigenic C. Difficile by PCR: NEGATIVE

## 2020-08-13 LAB — STOOL CULTURE: E coli, Shiga toxin Assay: NEGATIVE

## 2020-08-16 LAB — OVA AND PARASITE EXAMINATION
CONCENTRATE RESULT:: NONE SEEN
MICRO NUMBER:: 11353111
SPECIMEN QUALITY:: ADEQUATE
TRICHROME RESULT:: NONE SEEN

## 2020-09-06 ENCOUNTER — Telehealth: Payer: Self-pay | Admitting: Family Medicine

## 2020-09-06 NOTE — Telephone Encounter (Signed)
Appointment made for in person , pt states no fever , just chest pain with cough .

## 2020-09-06 NOTE — Telephone Encounter (Signed)
Patient would like  for Dr. Nani Ravens to send a prescription for albuterol  And a antibiotics  for his Bronchitis

## 2020-09-06 NOTE — Telephone Encounter (Signed)
Sched appt plz. Ty. 

## 2020-09-07 ENCOUNTER — Other Ambulatory Visit: Payer: Self-pay | Admitting: Family Medicine

## 2020-09-07 ENCOUNTER — Encounter: Payer: Self-pay | Admitting: Family Medicine

## 2020-09-07 ENCOUNTER — Ambulatory Visit: Payer: Self-pay | Admitting: Family Medicine

## 2020-09-07 ENCOUNTER — Other Ambulatory Visit: Payer: Self-pay

## 2020-09-07 ENCOUNTER — Ambulatory Visit (INDEPENDENT_AMBULATORY_CARE_PROVIDER_SITE_OTHER): Payer: Self-pay | Admitting: Family Medicine

## 2020-09-07 VITALS — BP 110/68 | HR 60 | Temp 98.0°F | Ht 67.0 in | Wt 147.4 lb

## 2020-09-07 DIAGNOSIS — J209 Acute bronchitis, unspecified: Secondary | ICD-10-CM

## 2020-09-07 MED ORDER — PROMETHAZINE-DM 6.25-15 MG/5ML PO SYRP: 5.0000 mL | ORAL_SOLUTION | Freq: Four times a day (QID) | ORAL | 0 refills | Status: DC | PRN

## 2020-09-07 MED ORDER — PREDNISONE 20 MG PO TABS: 40.0000 mg | ORAL_TABLET | Freq: Every day | ORAL | 0 refills | Status: DC

## 2020-09-07 MED ORDER — ALBUTEROL SULFATE HFA 108 (90 BASE) MCG/ACT IN AERS: 2.0000 | INHALATION_SPRAY | Freq: Four times a day (QID) | RESPIRATORY_TRACT | 0 refills | Status: DC | PRN

## 2020-09-07 MED FILL — ALBUTEROL SULFATE HFA 108 (: 108 (90 BAS | 25 days supply | Qty: 9 | Fill #0

## 2020-09-07 MED FILL — PROMETHAZINE W/DM SYRUP: 6.25-15 | 6 days supply | Qty: 118 | Fill #0

## 2020-09-07 MED FILL — predniSONE 20 MG TABS: 20 | 5 days supply | Qty: 10 | Fill #0

## 2020-09-07 NOTE — Patient Instructions (Addendum)
Continue to push fluids, practice good hand hygiene, and cover your mouth if you cough.  If you start having fevers, shaking or shortness of breath, seek immediate care.  Send me a message Monday if you aren't better.   Let us know if you need anything.

## 2020-09-07 NOTE — Progress Notes (Signed)
Chief Complaint  Patient presents with  . Cough    Jimmy Lane here for URI complaints.  Duration: 2 days  Associated symptoms: sinus congestion, rhinorrhea, sore throat and cough Denies: sinus pain, itchy watery eyes, ear pain, ear drainage, wheezing, shortness of breath, myalgia and fevers, N/V/D Treatment to date: Aleve, Albuterol Sick contacts: No  Past Medical History:  Diagnosis Date  . Essential hypertension 10/09/2016  . Glaucoma   . Hyperlipidemia   . Prediabetes 01/29/2017    BP 110/68 (BP Location: Left Arm, Patient Position: Sitting, Cuff Size: Normal)   Pulse 60   Temp 98 F (36.7 C) (Oral)   Ht 5\' 7"  (1.702 m)   Wt 147 lb 6 oz (66.8 kg)   SpO2 98%   BMI 23.08 kg/m  General: Awake, alert, appears stated age HEENT: AT, [name], ears patent b/l and TM's neg, nares patent w/o discharge, pharynx pink and without exudates, MMM Neck: No masses or asymmetry Heart: RRR Lungs: CTAB, no accessory muscle use Psych: Age appropriate judgment and insight, normal mood and affect  Acute bronchitis, unspecified organism - Plan: predniSONE (DELTASONE) 20 MG tablet, promethazine-dextromethorphan (PROMETHAZINE-DM) 6.25-15 MG/5ML syrup, albuterol (VENTOLIN HFA) 108 (90 Base) MCG/ACT inhaler  Doubt this is bacterial. Supportive care as above. I think he has baseline inflammation in his lungs given his smoking hx.  Continue to push fluids, practice good hand hygiene, cover mouth when coughing. F/u prn. If starting to experience fevers, shaking, or shortness of breath, seek immediate care. Pt voiced understanding and agreement to the plan.  [place], DO 09/07/20 11:57 AM

## 2020-09-13 ENCOUNTER — Other Ambulatory Visit: Payer: Self-pay

## 2020-09-13 DIAGNOSIS — J209 Acute bronchitis, unspecified: Secondary | ICD-10-CM

## 2020-09-13 MED ORDER — ALBUTEROL SULFATE HFA 108 (90 BASE) MCG/ACT IN AERS: 2.0000 | INHALATION_SPRAY | Freq: Four times a day (QID) | RESPIRATORY_TRACT | 5 refills | Status: DC | PRN

## 2020-11-20 ENCOUNTER — Other Ambulatory Visit: Payer: Self-pay | Admitting: Family Medicine

## 2020-11-23 ENCOUNTER — Ambulatory Visit: Payer: Self-pay | Attending: Internal Medicine

## 2020-11-23 DIAGNOSIS — Z23 Encounter for immunization: Secondary | ICD-10-CM

## 2020-11-23 NOTE — Progress Notes (Signed)
   Covid-19 Vaccination Clinic  Name:  Jimmy Lane    MRN: [id number] DOB: 1955-10-25  11/23/2020  [name] was observed post Covid-19 immunization for 15 minutes without incident. He was provided with Vaccine Information Sheet and instruction to access the V-Safe system.   [name] was instructed to call 911 with any severe reactions post vaccine: Mar[place] Kitchen Difficulty breathing  . Swelling of face and throat  . A fast heartbeat  . A bad rash all over body  . Dizziness and weakness   Immunizations Administered    Name Date Dose VIS Date Route   PFIZER Comrnaty(Gray TOP) Covid-19 Vaccine 11/23/2020 11:03 AM 0.3 mL 07/26/2020 Intramuscular   Manufacturer: [place]   Lot: [id number]   [place]: [phone]

## 2020-11-30 ENCOUNTER — Other Ambulatory Visit (HOSPITAL_BASED_OUTPATIENT_CLINIC_OR_DEPARTMENT_OTHER): Payer: Self-pay

## 2020-11-30 MED ORDER — PFIZER-BIONT COVID-19 VAC-TRIS 30 MCG/0.3ML IM SUSP
INTRAMUSCULAR | 0 refills | Status: AC
  Filled 2020-11-30: qty 0.3, 1d supply, fill #0

## 2021-01-09 DIAGNOSIS — J209 Acute bronchitis, unspecified: Secondary | ICD-10-CM

## 2021-01-09 MED ORDER — ALBUTEROL SULFATE HFA 108 (90 BASE) MCG/ACT IN AERS: 2.0000 | INHALATION_SPRAY | Freq: Four times a day (QID) | RESPIRATORY_TRACT | 5 refills | Status: AC | PRN

## 2021-01-17 ENCOUNTER — Other Ambulatory Visit: Payer: Self-pay | Admitting: Family Medicine

## 2021-01-17 DIAGNOSIS — I10 Essential (primary) hypertension: Secondary | ICD-10-CM

## 2021-02-16 ENCOUNTER — Other Ambulatory Visit: Payer: Self-pay | Admitting: Family Medicine

## 2021-02-16 DIAGNOSIS — Z9189 Other specified personal risk factors, not elsewhere classified: Secondary | ICD-10-CM

## 2021-03-08 ENCOUNTER — Ambulatory Visit (INDEPENDENT_AMBULATORY_CARE_PROVIDER_SITE_OTHER): Payer: Medicare Other | Admitting: Family Medicine

## 2021-03-08 ENCOUNTER — Encounter: Payer: Self-pay | Admitting: Family Medicine

## 2021-03-08 ENCOUNTER — Other Ambulatory Visit: Payer: Self-pay

## 2021-03-08 VITALS — BP 120/80 | HR 60 | Temp 98.1°F | Ht 67.0 in | Wt 149.5 lb

## 2021-03-08 DIAGNOSIS — Z125 Encounter for screening for malignant neoplasm of prostate: Secondary | ICD-10-CM

## 2021-03-08 DIAGNOSIS — Z23 Encounter for immunization: Secondary | ICD-10-CM

## 2021-03-08 DIAGNOSIS — I10 Essential (primary) hypertension: Secondary | ICD-10-CM

## 2021-03-08 DIAGNOSIS — Z9189 Other specified personal risk factors, not elsewhere classified: Secondary | ICD-10-CM | POA: Diagnosis not present

## 2021-03-08 DIAGNOSIS — Z0000 Encounter for general adult medical examination without abnormal findings: Secondary | ICD-10-CM

## 2021-03-08 DIAGNOSIS — R7303 Prediabetes: Secondary | ICD-10-CM

## 2021-03-08 LAB — LIPID PANEL
Cholesterol: 102 mg/dL (ref 0–200)
HDL: 46.3 mg/dL (ref >39.00)
LDL Cholesterol: 22 mg/dL (ref 0–99)
NonHDL: 55.86
Total CHOL/HDL Ratio: 2
Triglycerides: 167 mg/dL — ABNORMAL HIGH (ref 0.0–149.0)
VLDL: 33.4 mg/dL (ref 0.0–40.0)

## 2021-03-08 LAB — PSA, MEDICARE: PSA: 1.95 ng/ml (ref 0.10–4.00)

## 2021-03-08 LAB — HEMOGLOBIN A1C: Hgb A1c MFr Bld: 6.2 % (ref 4.6–6.5)

## 2021-03-08 LAB — COMPREHENSIVE METABOLIC PANEL
ALT: 20 U/L (ref 0–53)
AST: 22 U/L (ref 0–37)
Albumin: 4.5 g/dL (ref 3.5–5.2)
Alkaline Phosphatase: 80 U/L (ref 39–117)
BUN: 28 mg/dL — ABNORMAL HIGH (ref 6–23)
CO2: 32 mEq/L (ref 19–32)
Calcium: 9.9 mg/dL (ref 8.4–10.5)
Chloride: 100 mEq/L (ref 96–112)
Creatinine, Ser: 1.03 mg/dL (ref 0.40–1.50)
GFR: 76.43 mL/min (ref >60.00)
Glucose, Bld: 84 mg/dL (ref 70–99)
Potassium: 4.3 mEq/L (ref 3.5–5.1)
Sodium: 139 mEq/L (ref 135–145)
Total Bilirubin: 1.3 mg/dL — ABNORMAL HIGH (ref 0.2–1.2)
Total Protein: 6.9 g/dL (ref 6.0–8.3)

## 2021-03-08 LAB — CBC
HCT: 47.4 % (ref 39.0–52.0)
Hemoglobin: 15.3 g/dL (ref 13.0–17.0)
MCHC: 32.3 g/dL (ref 30.0–36.0)
MCV: 92.3 fl (ref 78.0–100.0)
Platelets: 194 10*3/uL (ref 150.0–400.0)
RBC: 5.13 Mil/uL (ref 4.22–5.81)
RDW: 13.5 % (ref 11.5–15.5)
WBC: 4.2 10*3/uL (ref 4.0–10.5)

## 2021-03-08 NOTE — Patient Instructions (Signed)
Contact your pharmacy regarding the tetanus shot (which insurance may not cover) and the shingles shot.  The new Shingrix vaccine (for shingles) is a 2 shot series. It can make people feel low energy, achy and almost like they have the flu for 48 hours after injection. Please plan accordingly when deciding on when to get this shot. The second shot of the series is less severe regarding the side effects, but it still lasts 48 hours.   Keep the diet clean and stay active.  Let us know if you need anything.

## 2021-03-08 NOTE — Progress Notes (Signed)
Chief Complaint  Patient presents with   Cough    Update vaccines     Subjective: Pt here for initial Welcome to Medicare Evaluation.  Vision Screen:20/30 R and 20/30 L  No results found.  Past Medical History:  Diagnosis Date   Essential hypertension 10/09/2016   Glaucoma    Hyperlipidemia    Prediabetes 01/29/2017   Family History  Problem Relation Age of Onset   Hypertension Paternal Grandfather    Colon cancer Neg Hx    Esophageal cancer Neg Hx    Stomach cancer Neg Hx    Rectal cancer Neg Hx    Past Surgical History:  Procedure Laterality Date   SHOULDER SURGERY  2016   Current Outpatient Medications on File Prior to Visit  Medication Sig Dispense Refill   albuterol (VENTOLIN HFA) 108 (90 Base) MCG/ACT inhaler Inhale 2 puffs into the lungs every 6 (six) hours as needed for wheezing or shortness of breath. 18 g 5   atorvastatin (LIPITOR) 40 MG tablet TAKE ONE TABLET BY MOUTH ONE TIME DAILY 90 tablet 3   chlorthalidone (HYGROTON) 25 MG tablet TAKE ONE TABLET BY MOUTH ONE TIME DAILY 90 tablet 0   COVID-19 mRNA Vac-TriS, Pfizer, (PFIZER-BIONT COVID-19 VAC-TRIS) SUSP injection Inject into the muscle. 0.3 mL 0   COVID-19 mRNA vaccine, Pfizer, 30 MCG/0.3ML injection INJECT AS DIRECTED .3 mL 0   dicyclomine (BENTYL) 10 MG capsule Take 1 tab every 6 hours as needed for abdominal cramping. 60 capsule 0   FLUARIX QUADRIVALENT 0.5 ML injection INJECT AS DIRECTED .5 mL 0   latanoprost (XALATAN) 0.005 % ophthalmic solution Place 1 drop into both eyes daily.     metoprolol tartrate (LOPRESSOR) 50 MG tablet TAKE ONE TABLET BY MOUTH TWICE A DAY 180 tablet 1   ondansetron (ZOFRAN-ODT) 4 MG disintegrating tablet Take 1 tablet (4 mg total) by mouth every 8 (eight) hours as needed for nausea or vomiting. 20 tablet 0   predniSONE (DELTASONE) 20 MG tablet TAKE 2 TABLETS (40 MG TOTAL) BY MOUTH DAILY WITH BREAKFAST FOR 5 DAYS. 10 tablet 0   promethazine-dextromethorphan (PROMETHAZINE-DM)  6.25-15 MG/5ML syrup TAKE 5 MLS BY MOUTH 4 (FOUR) TIMES DAILY AS NEEDED FOR COUGH. 118 mL 0   sildenafil (VIAGRA) 100 MG tablet TAKE ONE TABLET BY MOUTH ONE TIME DAILY AS DIRECTED 10 tablet 1   triamcinolone cream (KENALOG) 0.1 % Apply 1 application topically 2 (two) times daily. 30 g 0   No current facility-administered medications on file prior to visit.   No Known Allergies  Females: Smoking, alcohol/drugs, sunscreen  Mental Health/Substance abuse evaluation: Yes  PHQ-2:  Feelings of depression? No  Loss of satisfaction/pleasure in doing things? No   Fall Risk: Less than 2 falls within the past 12 months? No  Mindful of grabbing bars in bathroom, ruffles in rugs, poorly lit areas, handrails on the stairs? Yes   Discussion of functional ability done: Encouraged to maintain physical activity and flexibility. Live alone?  No;  Need help with the following? Bathing: No  Managing money: No  Taking medications: No  Telephone use: No  Transportation: No  Shopping: No  Preparing meals: No   Hearing/Vision screen: Trouble hearing TV or radio when others do not? No  Straining or struggling to hear/understand conversation? No  Fire safety: Have a working smoke alarm? Yes   Diet Balanced diet? Yes  3 meals daily? Yes  Assistance needed? No   BP 120/80   Pulse 60   Temp  98.1 F (36.7 C) (Oral)   Ht '5\' 7"'$  (1.702 m)   Wt 149 lb 8 oz (67.8 kg)   SpO2 99%   BMI 23.42 kg/m   End of life care planning/counseling: Does patient wish to discuss end of life care/planning? Yes  Does the patient have an advanced directive? No  Patient code status/living will: Full code Forms given? Yes   Patient Care Team: Shelda Pal, DO as PCP - General (Family Medicine)  Opioid Abuse screening Is the patient on opioids? No, the patient is not on opioids No risk factors, no referral placed.   Substance use disorder screening Any alcohol or drug use? No Risk factors:  None Referral placed? None  Assessment and Plan Welcome to Medicare preventive visit  Prediabetes - Plan: Hemoglobin A1c  10 year risk of MI or stroke 7.5% or greater - Plan: Comprehensive metabolic panel  Essential hypertension - Plan: CBC, Lipid panel  Screening PSA (prostate specific antigen) - Plan: PSA, Medicare ( [place] only)  Colonoscopy- Written plan in AVS for preventative health services. Immunizations UTD; Contact pharmacy for Shingrix and Tdap.   F/u in 6 mo or prn. The patient voiced understanding and agreement to the plan.  [place], DO 03/08/21 1:36 PM

## 2021-03-11 ENCOUNTER — Other Ambulatory Visit: Payer: Self-pay | Admitting: Family Medicine

## 2021-03-11 DIAGNOSIS — Z125 Encounter for screening for malignant neoplasm of prostate: Secondary | ICD-10-CM

## 2021-03-11 DIAGNOSIS — R972 Elevated prostate specific antigen [PSA]: Secondary | ICD-10-CM

## 2021-03-11 NOTE — Progress Notes (Signed)
psa

## 2021-04-25 ENCOUNTER — Other Ambulatory Visit: Payer: Medicare Other

## 2021-04-26 ENCOUNTER — Other Ambulatory Visit (INDEPENDENT_AMBULATORY_CARE_PROVIDER_SITE_OTHER): Payer: Medicare Other

## 2021-04-26 ENCOUNTER — Other Ambulatory Visit: Payer: Self-pay

## 2021-04-26 DIAGNOSIS — R972 Elevated prostate specific antigen [PSA]: Secondary | ICD-10-CM

## 2021-04-26 DIAGNOSIS — Z125 Encounter for screening for malignant neoplasm of prostate: Secondary | ICD-10-CM | POA: Diagnosis not present

## 2021-04-26 LAB — PSA: PSA: 1.28 ng/mL (ref 0.10–4.00)

## 2021-05-03 ENCOUNTER — Other Ambulatory Visit: Payer: Self-pay | Admitting: Family Medicine

## 2021-05-07 ENCOUNTER — Ambulatory Visit: Payer: Medicare Other | Attending: Internal Medicine

## 2021-05-07 ENCOUNTER — Other Ambulatory Visit (HOSPITAL_BASED_OUTPATIENT_CLINIC_OR_DEPARTMENT_OTHER): Payer: Self-pay

## 2021-05-07 DIAGNOSIS — Z23 Encounter for immunization: Secondary | ICD-10-CM

## 2021-05-07 MED ORDER — INFLUENZA VAC A&B SA ADJ QUAD 0.5 ML IM PRSY
PREFILLED_SYRINGE | INTRAMUSCULAR | 0 refills | Status: AC
  Filled 2021-05-07: qty 0.5, 1d supply, fill #0

## 2021-05-07 NOTE — Progress Notes (Signed)
   Covid-19 Vaccination Clinic  Name:  LOUIE FLENNER    MRN: [id number] DOB: May 03, 1956  05/07/2021  Mr. Fandino was observed post Covid-19 immunization for 15 minutes without incident. He was provided with Vaccine Information Sheet and instruction to access the V-Safe system.   Mr. Apostol was instructed to call 911 with any severe reactions post vaccine: Difficulty breathing  Swelling of face and throat  A fast heartbeat  A bad rash all over body  Dizziness and weakness

## 2021-05-13 ENCOUNTER — Other Ambulatory Visit (HOSPITAL_BASED_OUTPATIENT_CLINIC_OR_DEPARTMENT_OTHER): Payer: Self-pay

## 2021-05-13 MED ORDER — COVID-19MRNA BIVAL VACC PFIZER 30 MCG/0.3ML IM SUSP
INTRAMUSCULAR | 0 refills | Status: AC
  Filled 2021-05-13: qty 0.3, 1d supply, fill #0

## 2021-06-10 ENCOUNTER — Other Ambulatory Visit: Payer: Self-pay | Admitting: Family Medicine

## 2021-06-10 DIAGNOSIS — I10 Essential (primary) hypertension: Secondary | ICD-10-CM

## 2021-07-22 ENCOUNTER — Other Ambulatory Visit: Payer: Self-pay | Admitting: Family Medicine

## 2021-08-28 ENCOUNTER — Other Ambulatory Visit: Payer: Self-pay | Admitting: Family Medicine

## 2021-09-07 ENCOUNTER — Other Ambulatory Visit: Payer: Self-pay | Admitting: Family Medicine

## 2021-09-07 DIAGNOSIS — I10 Essential (primary) hypertension: Secondary | ICD-10-CM

## 2021-11-13 ENCOUNTER — Telehealth: Payer: Self-pay

## 2021-11-13 NOTE — Telephone Encounter (Signed)
Called pt to schedule medicare wellness visit. LVM ?

## 2021-11-28 ENCOUNTER — Other Ambulatory Visit: Payer: Self-pay | Admitting: Family Medicine

## 2021-11-28 NOTE — Telephone Encounter (Signed)
Left message on machine that patient needs to scheduled follow up visit with Dr. Nani Ravens. ?

## 2021-12-11 ENCOUNTER — Other Ambulatory Visit: Payer: Self-pay | Admitting: Family Medicine

## 2021-12-11 DIAGNOSIS — I10 Essential (primary) hypertension: Secondary | ICD-10-CM

## 2022-01-22 ENCOUNTER — Other Ambulatory Visit: Payer: Self-pay | Admitting: Family Medicine

## 2022-02-23 ENCOUNTER — Other Ambulatory Visit: Payer: Self-pay | Admitting: Family Medicine

## 2022-02-23 DIAGNOSIS — Z9189 Other specified personal risk factors, not elsewhere classified: Secondary | ICD-10-CM

## 2022-02-26 ENCOUNTER — Telehealth: Payer: Self-pay | Admitting: Family Medicine

## 2022-02-26 NOTE — Telephone Encounter (Signed)
Left message for patient to call back and schedule Medicare Annual Wellness Visit (AWV).   Please offer to do virtually or by telephone.  Left office number and my jabber [phone].  AWVI eligible as of 03/09/2022  Please schedule at anytime with Nurse Health Advisor.

## 2022-03-15 ENCOUNTER — Other Ambulatory Visit: Payer: Self-pay | Admitting: Family Medicine

## 2022-03-15 DIAGNOSIS — I10 Essential (primary) hypertension: Secondary | ICD-10-CM

## 2022-03-26 ENCOUNTER — Telehealth: Payer: Self-pay | Admitting: Family Medicine

## 2022-03-26 NOTE — Telephone Encounter (Signed)
Left message for patient to call back and schedule Medicare Annual Wellness Visit (AWV).   Please offer to do virtually or by telephone.  Left office number and my jabber [phone].  Welcome to Medicare: 03/08/2021  Please schedule at anytime with Nurse Health Advisor.

## 2022-05-27 ENCOUNTER — Other Ambulatory Visit: Payer: Self-pay | Admitting: Family Medicine

## 2022-05-27 DIAGNOSIS — I10 Essential (primary) hypertension: Secondary | ICD-10-CM

## 2022-07-24 ENCOUNTER — Other Ambulatory Visit: Payer: Self-pay | Admitting: Family Medicine

## 2022-08-25 ENCOUNTER — Encounter: Payer: Self-pay | Admitting: *Deleted

## 2022-08-25 ENCOUNTER — Other Ambulatory Visit: Payer: Self-pay | Admitting: Family Medicine

## 2022-08-25 DIAGNOSIS — I10 Essential (primary) hypertension: Secondary | ICD-10-CM

## 2022-08-29 ENCOUNTER — Other Ambulatory Visit: Payer: Self-pay | Admitting: Family Medicine

## 2022-08-29 DIAGNOSIS — I10 Essential (primary) hypertension: Secondary | ICD-10-CM

## 2022-10-28 ENCOUNTER — Other Ambulatory Visit: Payer: Self-pay | Admitting: Family Medicine

## 2022-10-28 DIAGNOSIS — I10 Essential (primary) hypertension: Secondary | ICD-10-CM

## 2022-11-20 ENCOUNTER — Other Ambulatory Visit: Payer: Self-pay | Admitting: Family Medicine

## 2022-11-20 DIAGNOSIS — Z9189 Other specified personal risk factors, not elsewhere classified: Secondary | ICD-10-CM

## 2023-07-04 ENCOUNTER — Other Ambulatory Visit: Payer: Self-pay | Admitting: Family Medicine

## 2023-07-04 DIAGNOSIS — Z9189 Other specified personal risk factors, not elsewhere classified: Secondary | ICD-10-CM

## 2023-07-06 ENCOUNTER — Encounter: Payer: Self-pay | Admitting: Gastroenterology
